# Patient Record
Sex: Male | Born: 1935 | Race: Black or African American | Hispanic: No | Marital: Married | Smoking: Former smoker
Health system: Southern US, Community
[De-identification: ages and names within clinical notes are randomized; demographics above are authoritative.]

## PROBLEM LIST (undated history)

## (undated) DIAGNOSIS — R3914 Feeling of incomplete bladder emptying: Principal | ICD-10-CM

## (undated) DIAGNOSIS — E785 Hyperlipidemia, unspecified: Secondary | ICD-10-CM

## (undated) DIAGNOSIS — K219 Gastro-esophageal reflux disease without esophagitis: Secondary | ICD-10-CM

## (undated) DIAGNOSIS — R972 Elevated prostate specific antigen [PSA]: Secondary | ICD-10-CM

## (undated) DIAGNOSIS — F32A Depression, unspecified: Secondary | ICD-10-CM

## (undated) DIAGNOSIS — F329 Major depressive disorder, single episode, unspecified: Secondary | ICD-10-CM

## (undated) DIAGNOSIS — R338 Other retention of urine: Secondary | ICD-10-CM

## (undated) DIAGNOSIS — H409 Unspecified glaucoma: Secondary | ICD-10-CM

## (undated) DIAGNOSIS — I10 Essential (primary) hypertension: Secondary | ICD-10-CM

## (undated) DIAGNOSIS — N400 Enlarged prostate without lower urinary tract symptoms: Secondary | ICD-10-CM

## (undated) DIAGNOSIS — N401 Enlarged prostate with lower urinary tract symptoms: Principal | ICD-10-CM

## (undated) DIAGNOSIS — J45909 Unspecified asthma, uncomplicated: Secondary | ICD-10-CM

## (undated) DIAGNOSIS — N189 Chronic kidney disease, unspecified: Secondary | ICD-10-CM

## (undated) DIAGNOSIS — F0390 Unspecified dementia without behavioral disturbance: Secondary | ICD-10-CM

## (undated) DIAGNOSIS — N390 Urinary tract infection, site not specified: Secondary | ICD-10-CM

## (undated) HISTORY — DX: Unspecified asthma, uncomplicated: J45.909

## (undated) HISTORY — DX: Other retention of urine: R33.8

## (undated) HISTORY — DX: Unspecified glaucoma: H40.9

## (undated) HISTORY — DX: Elevated prostate specific antigen (PSA): R97.20

## (undated) HISTORY — DX: Benign prostatic hyperplasia with lower urinary tract symptoms: N40.1

## (undated) HISTORY — DX: Hyperlipidemia, unspecified: E78.5

## (undated) HISTORY — DX: Depression, unspecified: F32.A

## (undated) HISTORY — DX: Major depressive disorder, single episode, unspecified: F32.9

## (undated) HISTORY — DX: Chronic kidney disease, unspecified: N18.9

## (undated) HISTORY — DX: Benign prostatic hyperplasia without lower urinary tract symptoms: N40.0

## (undated) HISTORY — DX: Urinary tract infection, site not specified: N39.0

## (undated) HISTORY — DX: Feeling of incomplete bladder emptying: R39.14

## (undated) HISTORY — DX: Gastro-esophageal reflux disease without esophagitis: K21.9

## (undated) HISTORY — DX: Unspecified dementia, unspecified severity, without behavioral disturbance, psychotic disturbance, mood disturbance, and anxiety: F03.90

---

## 2006-03-12 ENCOUNTER — Emergency Department: Payer: Self-pay | Admitting: Emergency Medicine

## 2006-05-26 ENCOUNTER — Other Ambulatory Visit: Payer: Self-pay

## 2006-05-26 ENCOUNTER — Emergency Department: Payer: Self-pay | Admitting: Emergency Medicine

## 2008-12-24 ENCOUNTER — Ambulatory Visit: Payer: Self-pay | Admitting: Family Medicine

## 2009-03-23 ENCOUNTER — Emergency Department: Payer: Self-pay | Admitting: Emergency Medicine

## 2009-10-25 ENCOUNTER — Emergency Department: Payer: Self-pay | Admitting: Emergency Medicine

## 2011-02-10 ENCOUNTER — Ambulatory Visit: Payer: Self-pay | Admitting: Ophthalmology

## 2011-02-22 ENCOUNTER — Ambulatory Visit: Payer: Self-pay | Admitting: Ophthalmology

## 2011-05-09 ENCOUNTER — Ambulatory Visit: Payer: Self-pay | Admitting: Ophthalmology

## 2011-05-23 ENCOUNTER — Ambulatory Visit: Payer: Self-pay | Admitting: Ophthalmology

## 2012-06-07 ENCOUNTER — Emergency Department: Payer: Self-pay | Admitting: Emergency Medicine

## 2012-06-07 LAB — URINALYSIS, COMPLETE
Bilirubin,UR: NEGATIVE
Glucose,UR: NEGATIVE mg/dL (ref 0–75)
Ketone: NEGATIVE
Nitrite: POSITIVE
Ph: 5 (ref 4.5–8.0)
Protein: NEGATIVE
RBC,UR: 1 /HPF (ref 0–5)
Squamous Epithelial: 1
WBC UR: 65 /HPF (ref 0–5)

## 2014-07-26 NOTE — Op Note (Signed)
PATIENT NAME:  Duane MonarchDOWNEY, [name] MR#:  [id number] DATE OF BIRTH:  06/20/1932  DATE OF PROCEDURE:  05/23/2011  PREOPERATIVE DIAGNOSIS: Visually significant cataract of the right eye.   POSTOPERATIVE DIAGNOSIS: Visually significant cataract of the right eye.   OPERATIVE PROCEDURE: Cataract extraction by phacoemulsification with implant of intraocular lens to right eye.   SURGEON: Galen ManilaWilliam Dakin Madani, MD.   ANESTHESIA:  1. Managed anesthesia care.  2. Topical tetracaine drops followed by 2% Xylocaine jelly applied in the preoperative holding area.   COMPLICATIONS: None.   TECHNIQUE:  Stop and chop.  DESCRIPTION OF PROCEDURE: The patient was examined and consented in the preoperative holding area where the aforementioned topical anesthesia was applied to the right eye and then brought back to the Operating Room where the right eye was prepped and draped in the usual sterile ophthalmic fashion and a lid speculum was placed. A paracentesis was created with the side port blade and the anterior chamber was filled with viscoelastic. A near clear corneal incision was performed with the steel keratome. A continuous curvilinear capsulorrhexis was performed with a cystotome followed by the capsulorrhexis forceps. Hydrodissection and hydrodelineation were carried out with BSS on a blunt cannula. The lens was removed in a stop and chop technique and the remaining cortical material was removed with the irrigation-aspiration handpiece. The capsular bag was inflated with viscoelastic and the Alcon SN60WF 23.5-diopter lens, serial number [id number] was placed in the capsular bag without complication. The remaining viscoelastic was removed from the eye with the irrigation-aspiration handpiece. The wounds were hydrated. The anterior chamber was flushed with Miostat and the eye was inflated to physiologic pressure. The wounds were found to be water tight. The eye was dressed with Vigamox and Omnipred. The patient was given  protective glasses to wear throughout the day and a shield with which to sleep tonight. The patient was also given drops with which to begin a drop regimen today and will follow-up with me in one day.  ____________________________ Jerilee FieldWilliam L. Lillyahna Hemberger, MD wlp:slb D: 05/23/2011 12:19:28 ET T: 05/23/2011 12:26:28 ET JOB#: [id number]  cc: Rosan Calbert L. Kammi Hechler, MD, <Dictator> Jerilee FieldWILLIAM L Carlynn Leduc MD ELECTRONICALLY SIGNED 05/26/2011 15:31

## 2014-09-22 ENCOUNTER — Emergency Department
Admission: EM | Admit: 2014-09-22 | Discharge: 2014-09-22 | Disposition: A | Discharge status: Home / Self Care | Payer: Medicare Other | Attending: Emergency Medicine | Admitting: Emergency Medicine

## 2014-09-22 ENCOUNTER — Encounter: Payer: Self-pay | Admitting: Emergency Medicine

## 2014-09-22 DIAGNOSIS — Z79899 Other long term (current) drug therapy: Secondary | ICD-10-CM | POA: Insufficient documentation

## 2014-09-22 DIAGNOSIS — R339 Retention of urine, unspecified: Secondary | ICD-10-CM | POA: Diagnosis not present

## 2014-09-22 DIAGNOSIS — R338 Other retention of urine: Secondary | ICD-10-CM

## 2014-09-22 DIAGNOSIS — I10 Essential (primary) hypertension: Secondary | ICD-10-CM | POA: Insufficient documentation

## 2014-09-22 DIAGNOSIS — N3090 Cystitis, unspecified without hematuria: Secondary | ICD-10-CM | POA: Insufficient documentation

## 2014-09-22 DIAGNOSIS — R109 Unspecified abdominal pain: Secondary | ICD-10-CM | POA: Diagnosis present

## 2014-09-22 HISTORY — DX: Essential (primary) hypertension: I10

## 2014-09-22 LAB — CBC WITH DIFFERENTIAL/PLATELET
BASOS ABS: 0 10*3/uL (ref 0–0.1)
Basophils Relative: 0 %
EOS PCT: 0 %
Eosinophils Absolute: 0 10*3/uL (ref 0–0.7)
HCT: 43.5 % (ref 40.0–52.0)
Hemoglobin: 14.4 g/dL (ref 13.0–18.0)
Lymphocytes Relative: 2 %
Lymphs Abs: 0.2 10*3/uL — ABNORMAL LOW (ref 1.0–3.6)
MCH: 28.1 pg (ref 26.0–34.0)
MCHC: 33.2 g/dL (ref 32.0–36.0)
MCV: 84.6 fL (ref 80.0–100.0)
Monocytes Absolute: 0.2 10*3/uL (ref 0.2–1.0)
Monocytes Relative: 2 %
NEUTROS PCT: 96 %
Neutro Abs: 9.9 10*3/uL — ABNORMAL HIGH (ref 1.4–6.5)
Platelets: 161 10*3/uL (ref 150–440)
RBC: 5.14 MIL/uL (ref 4.40–5.90)
RDW: 15.2 % — ABNORMAL HIGH (ref 11.5–14.5)
WBC: 10.3 10*3/uL (ref 3.8–10.6)

## 2014-09-22 LAB — BASIC METABOLIC PANEL WITH GFR
Anion gap: 6 (ref 5–15)
BUN: 17 mg/dL (ref 6–20)
CO2: 21 mmol/L — ABNORMAL LOW (ref 22–32)
Calcium: 8.8 mg/dL — ABNORMAL LOW (ref 8.9–10.3)
Chloride: 113 mmol/L — ABNORMAL HIGH (ref 101–111)
Creatinine, Ser: 2.27 mg/dL — ABNORMAL HIGH (ref 0.61–1.24)
GFR calc Af Amer: 30 mL/min — ABNORMAL LOW
GFR calc non Af Amer: 26 mL/min — ABNORMAL LOW
Glucose, Bld: 99 mg/dL (ref 65–99)
Potassium: 3.5 mmol/L (ref 3.5–5.1)
Sodium: 140 mmol/L (ref 135–145)

## 2014-09-22 LAB — URINALYSIS COMPLETE WITH MICROSCOPIC (ARMC ONLY)
BILIRUBIN URINE: NEGATIVE
GLUCOSE, UA: NEGATIVE mg/dL
KETONES UR: NEGATIVE mg/dL
Nitrite: POSITIVE — AB
PROTEIN: 30 mg/dL — AB
Specific Gravity, Urine: 1.015 (ref 1.005–1.030)
pH: 5 (ref 5.0–8.0)

## 2014-09-22 MED ORDER — SULFAMETHOXAZOLE-TRIMETHOPRIM 800-160 MG PO TABS: 1.0000 | ORAL_TABLET | Freq: Two times a day (BID) | ORAL | Status: DC

## 2014-09-22 NOTE — ED Notes (Signed)
Here for urinary freq and diarrhea.

## 2014-09-22 NOTE — ED Notes (Signed)
Pt to ed with c/o diarrhea and abd pain x 2 days.  Pt denies vomiting.

## 2014-09-22 NOTE — ED Notes (Signed)
MD at bedside. 

## 2014-09-22 NOTE — ED Provider Notes (Signed)
[facility] Emergency Department Provider Note  ____________________________________________  Time seen: 9:25 AM  I have reviewed the triage vital signs and the nursing notes.   HISTORY  Chief Complaint Diarrhea and Abdominal Pain    HPI Duane Ochoa is a 81 y.o. male who complains of abdominal pain for 2 days. He indicates it hurts across the bilateral lower quadrants. No nausea or vomiting. He has had some loose bowel movements yesterday. No fevers or chills chest pain shortness of breath dizziness lightheadedness or syncope. Never Had anything like this before. No dysuria, but he does report hesitancy and frequency and the feeling that he is not able to empty his bladder.     Past Medical History  Diagnosis Date  . Hypertension     There are no active problems to display for this patient.   History reviewed. No pertinent past surgical history.  Current Outpatient Rx  Name  Route  Sig  Dispense  Refill  . amLODipine (NORVASC) 10 MG tablet   Oral   Take 10 mg by mouth daily.         Marland Kitchen doxazosin (CARDURA) 8 MG tablet   Oral   Take 8 mg by mouth daily.         . hydrochlorothiazide (HYDRODIURIL) 25 MG tablet   Oral   Take 25 mg by mouth daily.         . simvastatin (ZOCOR) 20 MG tablet   Oral   Take 20 mg by mouth daily.         Marland Kitchen sulfamethoxazole-trimethoprim (BACTRIM DS) 800-160 MG per tablet   Oral   Take 1 tablet by mouth 2 (two) times daily.   14 tablet   0     Allergies Review of patient's allergies indicates no known allergies.  History reviewed. No pertinent family history.  Social History History  Substance Use Topics  . Smoking status: Never Smoker   . Smokeless tobacco: Not on file  . Alcohol Use: No    Review of Systems  Constitutional: No fever or chills. No weight changes Eyes:No blurry vision or double vision.  ENT: No sore throat. Cardiovascular: No chest pain. Respiratory: No dyspnea or  cough. Gastrointestinal: Abdominal pain as above.  No BRBPR or melena. Genitourinary: Difficulty urinating.. Musculoskeletal: Negative for back pain. No joint swelling or pain. Skin: Negative for rash. Neurological: Negative for headaches, focal weakness or numbness. Psychiatric:No anxiety or depression.   Endocrine:No hot/cold intolerance, changes in energy, or sleep difficulty.  10-point ROS otherwise negative.  ____________________________________________   PHYSICAL EXAM:  VITAL SIGNS: ED Triage Vitals  Enc Vitals Group     BP 09/22/14 0915 146/71 mmHg     Pulse Rate 09/22/14 0915 124     Resp 09/22/14 0915 18     Temp 09/22/14 0915 97.7 F (36.5 C)     Temp Source 09/22/14 0915 Oral     SpO2 09/22/14 0915 93 %     Weight 09/22/14 0915 235 lb (106.595 kg)     Height 09/22/14 0915 6' (1.829 m)     Head Cir --      Peak Flow --      Pain Score 09/22/14 0915 0     Pain Loc --      Pain Edu? --      Excl. in GC? --      Constitutional: Alert and oriented. Well appearing and in no distress. Eyes: No scleral icterus. No conjunctival pallor. PERRL.  EOMI ENT   Head: Normocephalic and atraumatic.   Nose: No congestion/rhinnorhea. No septal hematoma   Mouth/Throat: MMM, no pharyngeal erythema. No peritonsillar mass. No uvula shift.   Neck: No stridor. No SubQ emphysema. No meningismus. Hematological/Lymphatic/Immunilogical: No cervical lymphadenopathy. Cardiovascular: RRR. Normal and symmetric distal pulses are present in all extremities. No murmurs, rubs, or gallops. Respiratory: Normal respiratory effort without tachypnea nor retractions. Breath sounds are clear and equal bilaterally. No wheezes/rales/rhonchi. Gastrointestinal: Distended tender bladder. There is no CVA tenderness.  No rebound, rigidity, or guarding. Rectal exam reveals a enlarged prostate that is firm and nontender. Brown stool Hemoccult negative, QC controls checked Genitourinary:  deferred Musculoskeletal: Nontender with normal range of motion in all extremities. No joint effusions.  No lower extremity tenderness.  No edema. Neurologic:   Normal speech and language.  CN 2-10 normal. Motor grossly intact. No pronator drift.  Normal gait. No gross focal neurologic deficits are appreciated.  Skin:  Skin is warm, dry and intact. No rash noted.  No petechiae, purpura, or bullae. Psychiatric: Mood and affect are normal. Speech and behavior are normal. Patient exhibits appropriate insight and judgment.  ____________________________________________    LABS (pertinent positives/negatives) (all labs ordered are listed, but only abnormal results are displayed) Labs Reviewed  URINALYSIS COMPLETEWITH MICROSCOPIC ([facility] ONLY) - Abnormal; Notable for the following:    Color, Urine YELLOW (*)    APPearance HAZY (*)    Hgb urine dipstick 3+ (*)    Protein, ur 30 (*)    Nitrite POSITIVE (*)    Leukocytes, UA 3+ (*)    Bacteria, UA MANY (*)    Squamous Epithelial / LPF 0-5 (*)    All other components within normal limits  BASIC METABOLIC PANEL - Abnormal; Notable for the following:    Chloride 113 (*)    CO2 21 (*)    Creatinine, Ser 2.27 (*)    Calcium 8.8 (*)    GFR calc non Af Amer 26 (*)    GFR calc Af Amer 30 (*)    All other components within normal limits  CBC WITH DIFFERENTIAL/PLATELET - Abnormal; Notable for the following:    RDW 15.2 (*)    Neutro Abs 9.9 (*)    Lymphs Abs 0.2 (*)    All other components within normal limits  URINE CULTURE   ____________________________________________   EKG    ____________________________________________    RADIOLOGY    ____________________________________________   PROCEDURES  ____________________________________________   INITIAL IMPRESSION / ASSESSMENT AND PLAN / ED COURSE  Pertinent labs & imaging results that were available during my care of the patient were reviewed by me and considered in my  medical decision making (see chart for details).  Postvoid residual after urination greater than 900 mL's. Foley catheter placed. Labs show a slight elevation of creatinine at 2.2, I was unable to find any prior results in the EMR but this is likely due to the outlet obstruction from enlarged prostate and will now resolve with relief of urinary retention with a Foley catheter. He also has cystitis and urinary tract infection and will be started on Bactrim for this. He is well-appearing no acute distress medically stable and will be discharged home to follow up with primary care and urology.  ____________________________________________   FINAL CLINICAL IMPRESSION(S) / ED DIAGNOSES  Final diagnoses:  Acute urinary retention  Cystitis      Sharman Cheek, MD 09/22/14 1133

## 2014-09-22 NOTE — Discharge Instructions (Signed)
Acute Urinary Retention Acute urinary retention is the temporary inability to urinate. This is a common problem in older men. As men age their prostates become larger and block the flow of urine from the bladder. This is usually a problem that has come on gradually.  HOME CARE INSTRUCTIONS If you are sent home with a Foley catheter and a drainage system, you will need to discuss the best course of action with your health care provider. While the catheter is in, maintain a good intake of fluids. Keep the drainage bag emptied and lower than your catheter. This is so that contaminated urine will not flow back into your bladder, which could lead to a urinary tract infection. There are two main types of drainage bags. One is a large bag that usually is used at night. It has a good capacity that will allow you to sleep through the night without having to empty it. The second type is called a leg bag. It has a smaller capacity, so it needs to be emptied more frequently. However, the main advantage is that it can be attached by a leg strap and can go underneath your clothing, allowing you the freedom to move about or leave your home. Only take over-the-counter or prescription medicines for pain, discomfort, or fever as directed by your health care provider.  SEEK MEDICAL CARE IF:  You develop a low-grade fever.  You experience spasms or leakage of urine with the spasms. SEEK IMMEDIATE MEDICAL CARE IF:   You develop chills or fever.  Your catheter stops draining urine.  Your catheter falls out.  You start to develop increased bleeding that does not respond to rest and increased fluid intake. MAKE SURE YOU:  Understand these instructions.  Will watch your condition.  Will get help right away if you are not doing well or get worse. Document Released: 06/26/2000 Document Revised: 03/25/2013 Document Reviewed: 08/29/2012 [facility] Patient Information 2015 [place], [place]. This information is not  intended to replace advice given to you by your health care provider. Make sure you discuss any questions you have with your health care provider.  Urinary Tract Infection Urinary tract infections (UTIs) can develop anywhere along your urinary tract. Your urinary tract is your body's drainage system for removing wastes and extra water. Your urinary tract includes two kidneys, two ureters, a bladder, and a urethra. Your kidneys are a pair of bean-shaped organs. Each kidney is about the size of your fist. They are located below your ribs, one on each side of your spine. CAUSES Infections are caused by microbes, which are microscopic organisms, including fungi, viruses, and bacteria. These organisms are so small that they can only be seen through a microscope. Bacteria are the microbes that most commonly cause UTIs. SYMPTOMS  Symptoms of UTIs may vary by age and gender of the patient and by the location of the infection. Symptoms in young women typically include a frequent and intense urge to urinate and a painful, burning feeling in the bladder or urethra during urination. Older women and men are more likely to be tired, shaky, and weak and have muscle aches and abdominal pain. A fever may mean the infection is in your kidneys. Other symptoms of a kidney infection include pain in your back or sides below the ribs, nausea, and vomiting. DIAGNOSIS To diagnose a UTI, your caregiver will ask you about your symptoms. Your caregiver also will ask to provide a urine sample. The urine sample will be tested for bacteria and white  blood cells. White blood cells are made by your body to help fight infection. TREATMENT  Typically, UTIs can be treated with medication. Because most UTIs are caused by a bacterial infection, they usually can be treated with the use of antibiotics. The choice of antibiotic and length of treatment depend on your symptoms and the type of bacteria causing your infection. HOME CARE  INSTRUCTIONS  If you were prescribed antibiotics, take them exactly as your caregiver instructs you. Finish the medication even if you feel better after you have only taken some of the medication.  Drink enough water and fluids to keep your urine clear or pale yellow.  Avoid caffeine, tea, and carbonated beverages. They tend to irritate your bladder.  Empty your bladder often. Avoid holding urine for long periods of time.  Empty your bladder before and after sexual intercourse.  After a bowel movement, women should cleanse from front to back. Use each tissue only once. SEEK MEDICAL CARE IF:   You have back pain.  You develop a fever.  Your symptoms do not begin to resolve within 3 days. SEEK IMMEDIATE MEDICAL CARE IF:   You have severe back pain or lower abdominal pain.  You develop chills.  You have nausea or vomiting.  You have continued burning or discomfort with urination. MAKE SURE YOU:   Understand these instructions.  Will watch your condition.  Will get help right away if you are not doing well or get worse. Document Released: 12/28/2004 Document Revised: 09/19/2011 Document Reviewed: 04/28/2011 [facility] Patient Information 2015 [place], [place]. This information is not intended to replace advice given to you by your health care provider. Make sure you discuss any questions you have with your health care provider.   You were found to have a urinary tract infection today. He also have urinary retention which was relieved with a Foley catheter. Keep the Foley catheter in place at all times and follow up with your primary care doctor this week. Also follow-up with urology due to your enlarged prostate that may be causing the urinary retention. Your kidney function was less than normal today (creatinine was 2.2), which is likely due to the urinary retention and will now resolve. Please follow-up with your primary care doctor for further monitoring of this.

## 2014-09-22 NOTE — ED Notes (Signed)
D/C instructions given including care of foley catheter and use of leg bag. Reviewed need for follow up with both MDs. Patient/wife verbalized understanding.

## 2014-09-24 LAB — URINE CULTURE: Special Requests: NORMAL

## 2014-10-13 ENCOUNTER — Encounter: Payer: Self-pay | Admitting: Urology

## 2014-10-13 ENCOUNTER — Ambulatory Visit (INDEPENDENT_AMBULATORY_CARE_PROVIDER_SITE_OTHER): Payer: Medicare Other | Admitting: Urology

## 2014-10-13 VITALS — BP 213/96 | HR 98 | Ht 72.0 in | Wt 236.3 lb

## 2014-10-13 DIAGNOSIS — N3000 Acute cystitis without hematuria: Secondary | ICD-10-CM

## 2014-10-13 DIAGNOSIS — N390 Urinary tract infection, site not specified: Secondary | ICD-10-CM | POA: Insufficient documentation

## 2014-10-13 DIAGNOSIS — R338 Other retention of urine: Secondary | ICD-10-CM

## 2014-10-13 HISTORY — DX: Other retention of urine: R33.8

## 2014-10-13 MED ORDER — FINASTERIDE 5 MG PO TABS: 5.0000 mg | ORAL_TABLET | Freq: Every day | ORAL | Status: DC

## 2014-10-13 MED ORDER — TAMSULOSIN HCL 0.4 MG PO CAPS: 0.4000 mg | ORAL_CAPSULE | Freq: Every day | ORAL | Status: DC

## 2014-10-13 NOTE — Progress Notes (Signed)
Cc: f/u from ED, urinary retention, UTI  History of present illness: 89M who was seen in the ED 2.5 weeks prior for lower quadrant pain and diarrhea.  He was found to have a urinary tract infection and to be in acute urinary retention.  A catheter was placed and 900cc of urine was returned.  He was also started on Bactrim. Prior to the patient's presentation to the emergency department he had progressively worsening urinary frequency culminating in trips to the bathroom every 10-15 minutes. However, he feels as if he empties his bladder completely on a routine basis area he denies any urinary incontinence or urinary urgency. He feels that he has a fairly strong stream. The story is contradicted by his wife, who states that he does have urinary incontinence, postvoid dribbling, has intermittency, and has to strain to void.  The patient has no significant GU history. He is on doxazosin, it is unclear when this medication was started. However, he does not have a history of recurrent urinary tract infections or prostatitis. He has no history of gross hematuria.  Review of systems: A 12 point comprehensive review of systems was obtained and is negative unless otherwise stated in the history of present illness.  Patient Active Problem List   Diagnosis Date Noted  . UTI (urinary tract infection) 10/13/2014  . Acute urinary retention 10/13/2014    Current Outpatient Prescriptions on File Prior to Visit  Medication Sig Dispense Refill  . amLODipine (NORVASC) 10 MG tablet Take 10 mg by mouth daily.    Marland Kitchen. doxazosin (CARDURA) 8 MG tablet Take 8 mg by mouth daily.    . hydrochlorothiazide (HYDRODIURIL) 25 MG tablet Take 25 mg by mouth daily.    . simvastatin (ZOCOR) 20 MG tablet Take 20 mg by mouth daily.    Marland Kitchen. sulfamethoxazole-trimethoprim (BACTRIM DS) 800-160 MG per tablet Take 1 tablet by mouth 2 (two) times daily. 14 tablet 0   No current facility-administered medications on file prior to visit.     Past Medical History  Diagnosis Date  . Hypertension     No past surgical history on file.  History  Substance Use Topics  . Smoking status: Never Smoker   . Smokeless tobacco: Not on file  . Alcohol Use: No    No family history on file.  PE: There were no vitals filed for this visit. Patient appears to be in no acute distress  patient is alert and oriented x3 Atraumatic normocephalic head No cervical or supraclavicular lymphadenopathy appreciated No increased work of breathing, no audible wheezes/rhonchi Regular sinus rhythm/rate Abdomen is soft, nontender, nondistended, no CVA or suprapubic tenderness The patient's rectal exam demonstrates mildly enlarged prostate, smooth and symmetric. 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 Lower extremities are symmetric without appreciable edema Grossly neurologically intact   No results for input(s): WBC, HGB, HCT in the last 72 hours. No results for input(s): NA, K, CL, CO2, GLUCOSE, BUN, CREATININE, CALCIUM in the last 72 hours. No results for input(s): LABPT, INR in the last 72 hours. No results for input(s): LABURIN in the last 72 hours. Results for orders placed or performed during the hospital encounter of 09/22/14  Urine culture     Status: None   Collection Time: 09/22/14  9:52 AM  Result Value Ref Range Status   Specimen Description URINE, CLEAN CATCH  Final   Special Requests Normal  Final   Culture   Final  MULTIPLE SPECIES PRESENT, SUGGEST RECOLLECTION IF CLINICALLY INDICATED   Report Status 09/24/2014 FINAL  Final    Imaging: none  Imp: The patient developed acute urinary retention with concomitant urinary tract infection 3 weeks prior. The catheter was removed today and upon filling the bladder prior to removal he had multiple bladder spasms and as such, we are unable to truly do a voiding trial today. The patient does not appear to be  responding to doxazosin at this time. His prostate is certainly enlarged on exam and likely the culprit of his urinary retention. 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 These are stable on this point asymptomatic requiring no intervention.  Recommendations: I recommended the patient discontinue his doxazosin to start Flomax 0.4 mg daily. In addition, I recommended that the patient started on finasteride 5 mg daily. Given that the patient did not truly have a adequate voiding trial today, I recommended that he return to clinic tomorrow for a nurse visit on May, so that we can check a PVR. I will then follow up the patient 3 months to ensure that his voiding symptoms have improved.  Cc: Doristine Mango, NP [place], [place]

## 2014-10-13 NOTE — Progress Notes (Signed)
Fill and Pull Catheter Removal  Patient is present today for a catheter removal.  Patient was cleaned and prepped in a sterile fashion 70ml of sterile water/ saline was instilled into the bladder when the patient felt the urge to urinate. 10ml of water was then drained from the balloon.  A 16FR foley cath was removed from the bladder complications were noted as: bladder spasms, patient unable to hold urine while instilling the sterile water into the bladder, patient also was experiencing discomfort, after two attempts to fill I stopped and removed the catheter. .  Patient as then given some time to void on their own.  Patient cannot void    Preformed by: Eligha BridegroomSarah Watts, CMA  Follow up/ Additional notes: Patient was not able to void and per Dr. Marlou PorchHerrick will come back tomorrow for a PVR

## 2014-10-14 ENCOUNTER — Other Ambulatory Visit (INDEPENDENT_AMBULATORY_CARE_PROVIDER_SITE_OTHER): Payer: Medicare Other | Admitting: *Deleted

## 2014-10-14 VITALS — BP 160/82 | HR 90 | Resp 18 | Ht 72.0 in | Wt 238.0 lb

## 2014-10-14 DIAGNOSIS — N400 Enlarged prostate without lower urinary tract symptoms: Secondary | ICD-10-CM | POA: Diagnosis not present

## 2014-10-14 LAB — BLADDER SCAN AMB NON-IMAGING

## 2014-10-14 MED ORDER — FINASTERIDE 5 MG PO TABS: 5.0000 mg | ORAL_TABLET | Freq: Every day | ORAL | Status: DC

## 2014-10-14 MED ORDER — TAMSULOSIN HCL 0.4 MG PO CAPS: 0.4000 mg | ORAL_CAPSULE | Freq: Every day | ORAL | Status: DC

## 2014-10-14 NOTE — Progress Notes (Signed)
Continuous Intermittent Catheterization  Due to urinary retention patient is present today for a teaching of self I & O Catheterization. Patient was given detailed verbal and printed instructions of self catheterization. Patient was cleaned and prepped in a sterile fashion.  With instruction and assistance patient inserted a 14FR and urine return was noted 400 ml, urine was yellow in color. Patient tolerated well, no complications were noted Patient was given a sample bag with supplies to take home.  Instructions were given per protocol for patient to cath 4 times daily.  An order was placed with [employer] for catheters to be sent to the patient's home. Patient is to follow up in three (3) months.   Preformed by: Natividad BroodSteve Villa Burgin, CMA

## 2014-10-30 ENCOUNTER — Ambulatory Visit: Payer: Self-pay

## 2014-11-17 ENCOUNTER — Other Ambulatory Visit: Payer: Self-pay

## 2014-11-17 DIAGNOSIS — R339 Retention of urine, unspecified: Secondary | ICD-10-CM

## 2014-11-17 MED ORDER — TAMSULOSIN HCL 0.4 MG PO CAPS: 0.4000 mg | ORAL_CAPSULE | Freq: Every day | ORAL | Status: AC

## 2014-11-28 ENCOUNTER — Emergency Department
Admission: EM | Admit: 2014-11-28 | Discharge: 2014-11-28 | Disposition: A | Discharge status: Home / Self Care | Payer: Medicare Other | Attending: Emergency Medicine | Admitting: Emergency Medicine

## 2014-11-28 ENCOUNTER — Encounter: Payer: Self-pay | Admitting: Emergency Medicine

## 2014-11-28 DIAGNOSIS — Z79899 Other long term (current) drug therapy: Secondary | ICD-10-CM | POA: Diagnosis not present

## 2014-11-28 DIAGNOSIS — N3000 Acute cystitis without hematuria: Secondary | ICD-10-CM

## 2014-11-28 DIAGNOSIS — Z87891 Personal history of nicotine dependence: Secondary | ICD-10-CM | POA: Insufficient documentation

## 2014-11-28 DIAGNOSIS — I10 Essential (primary) hypertension: Secondary | ICD-10-CM | POA: Insufficient documentation

## 2014-11-28 DIAGNOSIS — R350 Frequency of micturition: Secondary | ICD-10-CM | POA: Diagnosis present

## 2014-11-28 LAB — GLUCOSE, CAPILLARY: GLUCOSE-CAPILLARY: 94 mg/dL (ref 65–99)

## 2014-11-28 LAB — URINALYSIS COMPLETE WITH MICROSCOPIC (ARMC ONLY)
Bilirubin Urine: NEGATIVE
Glucose, UA: NEGATIVE mg/dL
Nitrite: POSITIVE — AB
PROTEIN: 30 mg/dL — AB
Specific Gravity, Urine: 1.019 (ref 1.005–1.030)
pH: 5 (ref 5.0–8.0)

## 2014-11-28 MED ORDER — CIPROFLOXACIN HCL 500 MG PO TABS: 500.0000 mg | ORAL_TABLET | Freq: Two times a day (BID) | ORAL | Status: DC

## 2014-11-28 NOTE — ED Notes (Signed)
Pt with urinary frequency. Wets self when asleep. Answers yes when asked if a little urine comes out without warning. A/o.

## 2014-11-28 NOTE — ED Provider Notes (Signed)
[facility] Emergency Department Provider Note ____________________________________________  Time seen: Approximately 9:42 AM  I have reviewed the triage vital signs and the nursing notes.   HISTORY  Chief Complaint Urinary Frequency   HPI Duane Ochoa is a 81 y.o. male who presents to the emergency department for her urinary frequency. He states that for the past 4 days he has had to urinate every few minutes and states that he has had some incontinence at night. He denies abdominal pain. He denies fever.   Past Medical History  Diagnosis Date  . Hypertension   . GERD (gastroesophageal reflux disease)   . Asthma   . Depressed   . Glaucoma   . Hyperlipemia     Patient Active Problem List   Diagnosis Date Noted  . UTI (urinary tract infection) 10/13/2014  . Acute urinary retention 10/13/2014    Past Surgical History  Procedure Laterality Date  . None      Current Outpatient Rx  Name  Route  Sig  Dispense  Refill  . amLODipine (NORVASC) 10 MG tablet   Oral   Take 10 mg by mouth daily.         . ciprofloxacin (CIPRO) 500 MG tablet   Oral   Take 1 tablet (500 mg total) by mouth 2 (two) times daily.   10 tablet   0   . finasteride (PROSCAR) 5 MG tablet   Oral   Take 1 tablet (5 mg total) by mouth daily.   30 tablet   11   . hydrochlorothiazide (HYDRODIURIL) 25 MG tablet   Oral   Take 25 mg by mouth daily.         . simvastatin (ZOCOR) 20 MG tablet   Oral   Take 20 mg by mouth daily.         . tamsulosin (FLOMAX) 0.4 MG CAPS capsule   Oral   Take 1 capsule (0.4 mg total) by mouth daily.   30 capsule   0   . tamsulosin (FLOMAX) 0.4 MG CAPS capsule   Oral   Take 1 capsule (0.4 mg total) by mouth daily.   30 capsule   3     Allergies Review of patient's allergies indicates no known allergies.  Family History  Problem Relation Age of Onset  . Prostate cancer Brother     Social History Social History   Substance Use Topics  . Smoking status: Former Smoker -- 15 years    Quit date: 04/03/1994  . Smokeless tobacco: None  . Alcohol Use: No     Comment: quit in 1996    Review of Systems Constitutional: No fever/chills Eyes: No visual changes. ENT: No sore throat. Cardiovascular: Denies chest pain. Respiratory: Denies shortness of breath. Gastrointestinal: No abdominal pain.  No nausea, no vomiting.  No diarrhea.  No constipation. Genitourinary: Negative for dysuria. Musculoskeletal: Negative for back pain. Skin: Negative for rash. Neurological: Negative for headaches, focal weakness or numbness.  10-point ROS otherwise negative.  ____________________________________________   PHYSICAL EXAM:  VITAL SIGNS: ED Triage Vitals  Enc Vitals Group     BP 11/28/14 0747 147/73 mmHg     Pulse Rate 11/28/14 0747 88     Resp --      Temp 11/28/14 0747 97.5 F (36.4 C)     Temp Source 11/28/14 0747 Oral     SpO2 11/28/14 0747 96 %     Weight 11/28/14 0740 235 lb (106.595 kg)  Height 11/28/14 0740 6' (1.829 m)     Head Cir --      Peak Flow --      Pain Score 11/28/14 0740 0     Pain Loc --      Pain Edu? --      Excl. in GC? --     Constitutional: Alert and oriented. Well appearing and in no acute distress. Eyes: Conjunctivae are normal. PERRL. EOMI. Head: Atraumatic. Nose: No congestion/rhinnorhea. Mouth/Throat: Mucous membranes are moist.  Oropharynx non-erythematous. Neck: No stridor.   Cardiovascular: Normal rate, regular rhythm. Grossly normal heart sounds.  Good peripheral circulation. Respiratory: Normal respiratory effort.  No retractions. Lungs CTAB. Gastrointestinal: Soft and nontender. No distention. No abdominal bruits. No CVA tenderness. Musculoskeletal: No lower extremity tenderness nor edema.  No joint effusions. Neurologic:  Normal speech and language. No gross focal neurologic deficits are appreciated. No gait instability. Skin:  Skin is warm, dry and  intact. No rash noted. Psychiatric: Mood and affect are normal. Speech and behavior are normal.  ____________________________________________   LABS (all labs ordered are listed, but only abnormal results are displayed)  Labs Reviewed  URINALYSIS COMPLETEWITH MICROSCOPIC ([facility] ONLY) - Abnormal; Notable for the following:    Color, Urine AMBER (*)    APPearance CLOUDY (*)    Ketones, ur TRACE (*)    Hgb urine dipstick 2+ (*)    Protein, ur 30 (*)    Nitrite POSITIVE (*)    Leukocytes, UA 3+ (*)    Bacteria, UA MANY (*)    Squamous Epithelial / LPF 0-5 (*)    All other components within normal limits  GLUCOSE, CAPILLARY   ____________________________________________  EKG   ____________________________________________  RADIOLOGY   ____________________________________________   PROCEDURES  Procedure(s) performed: None  Critical Care performed: No  ____________________________________________   INITIAL IMPRESSION / ASSESSMENT AND PLAN / ED COURSE  Pertinent labs & imaging results that were available during my care of the patient were reviewed by me and considered in my medical decision making (see chart for details).  Patient was advised to follow-up with his primary care provider within 10 days for urine recheck he was advised to return to emergency department for symptoms that change or worsen if he is unable schedule an appointment. ____________________________________________   FINAL CLINICAL IMPRESSION(S) / ED DIAGNOSES  Final diagnoses:  Acute cystitis without hematuria      Chinita Pester, FNP 11/28/14 1132  Governor Rooks, MD 11/28/14 1359

## 2014-11-28 NOTE — ED Notes (Signed)
Urinary frequency x 4 days.

## 2014-11-28 NOTE — Discharge Instructions (Signed)

## 2014-12-04 ENCOUNTER — Encounter: Payer: Self-pay | Admitting: *Deleted

## 2014-12-08 ENCOUNTER — Ambulatory Visit (INDEPENDENT_AMBULATORY_CARE_PROVIDER_SITE_OTHER): Payer: Medicare Other | Admitting: Urology

## 2014-12-08 VITALS — BP 170/90 | HR 73 | Ht 72.0 in | Wt 232.2 lb

## 2014-12-08 DIAGNOSIS — R350 Frequency of micturition: Secondary | ICD-10-CM

## 2014-12-08 DIAGNOSIS — N183 Chronic kidney disease, stage 3 (moderate): Secondary | ICD-10-CM | POA: Diagnosis not present

## 2014-12-08 DIAGNOSIS — N189 Chronic kidney disease, unspecified: Secondary | ICD-10-CM | POA: Insufficient documentation

## 2014-12-08 DIAGNOSIS — N390 Urinary tract infection, site not specified: Secondary | ICD-10-CM | POA: Diagnosis not present

## 2014-12-08 DIAGNOSIS — R338 Other retention of urine: Secondary | ICD-10-CM

## 2014-12-08 DIAGNOSIS — N3000 Acute cystitis without hematuria: Secondary | ICD-10-CM | POA: Diagnosis not present

## 2014-12-08 HISTORY — DX: Chronic kidney disease, unspecified: N18.9

## 2014-12-08 HISTORY — DX: Urinary tract infection, site not specified: N39.0

## 2014-12-08 LAB — URINALYSIS, COMPLETE
Bilirubin, UA: NEGATIVE
Glucose, UA: NEGATIVE
Ketones, UA: NEGATIVE
Nitrite, UA: POSITIVE — AB
PH UA: 5.5 (ref 5.0–7.5)
Protein, UA: NEGATIVE
Specific Gravity, UA: 1.02 (ref 1.005–1.030)
Urobilinogen, Ur: 0.2 mg/dL (ref 0.2–1.0)

## 2014-12-08 LAB — MICROSCOPIC EXAMINATION
EPITHELIAL CELLS (NON RENAL): NONE SEEN /HPF (ref 0–10)
RBC, UA: NONE SEEN /hpf (ref 0–?)

## 2014-12-08 LAB — BLADDER SCAN AMB NON-IMAGING

## 2014-12-08 NOTE — Addendum Note (Signed)
Addended by: Martha Clan on: 12/08/2014 04:08 PM   Modules accepted: Orders

## 2014-12-08 NOTE — Progress Notes (Signed)
12/08/2014 10:46 AM   Duane Ochoa 1932-05-25 [id number]  Referring provider: Titus Mould, NP [address] [place], [place] [address]  No chief complaint on file.   HPI:  1 - Lower Urinary Tract Symptoms / History of Urinary Retention - Episode retention 2016 req foley for volume, subsequently placed on tamsulosin + finasteride and passed trial of void 10/2014. Now minimal bother.  2 - Recurrent UTI - two episodes cystitis 2016,  Both polymicrobial. No recent upper tract imaging. PVR 2016 "0mL"  3 - Chronic Renal Insuficiency - Cr 2.2 noted on labs 2016. No piror renal imaging on file.     PMH: Past Medical History  Diagnosis Date  . Hypertension   . GERD (gastroesophageal reflux disease)   . Asthma   . Depressed   . Glaucoma   . Hyperlipemia   . Elevated PSA   . BPH (benign prostatic hyperplasia)   . Dementia     Surgical History: Past Surgical History  Procedure Laterality Date  . None      Home Medications:    Medication List       This list is accurate as of: 12/08/14 10:46 AM.  Always use your most recent med list.               amLODipine 10 MG tablet  Commonly known as:  NORVASC  Take 10 mg by mouth daily.     ciprofloxacin 500 MG tablet  Commonly known as:  CIPRO  Take 1 tablet (500 mg total) by mouth 2 (two) times daily.     finasteride 5 MG tablet  Commonly known as:  PROSCAR  Take 1 tablet (5 mg total) by mouth daily.     hydrochlorothiazide 25 MG tablet  Commonly known as:  HYDRODIURIL  Take 25 mg by mouth daily.     simvastatin 20 MG tablet  Commonly known as:  ZOCOR  Take 20 mg by mouth daily.     tamsulosin 0.4 MG Caps capsule  Commonly known as:  FLOMAX  Take 1 capsule (0.4 mg total) by mouth daily.     tamsulosin 0.4 MG Caps capsule  Commonly known as:  FLOMAX  Take 1 capsule (0.4 mg total) by mouth daily.        Allergies: No Known Allergies  Family History: Family History  Problem Relation Age  of Onset  . Prostate cancer Brother     Social History:  reports that he quit smoking about 20 years ago. He does not have any smokeless tobacco history on file. He reports that he does not drink alcohol or use illicit drugs.  ROS:     Review of Systems  Gastrointestinal (upper)  : Negative for upper GI symptoms  Gastrointestinal (lower) : Negative for lower GI symptoms  Constitutional : Negative for symptoms  Skin: Negative for skin symptoms  Eyes: Negative for eye symptoms  Ear/Nose/Throat : Negative for Ear/Nose/Throat symptoms  Hematologic/Lymphatic: Negative for Hematologic/Lymphatic symptoms  Cardiovascular : Negative for cardiovascular symptoms  Respiratory : Negative for respiratory symptoms  Endocrine: Negative for endocrine symptoms  Musculoskeletal: Negative for musculoskeletal symptoms  Neurological: Negative for neurological symptoms  Psychologic: Negative for psychiatric symptoms                                       Physical Exam: There were no vitals taken for this visit.  Constitutional:  Alert and oriented, No acute distress. HEENT: [place] AT, moist mucus membranes.  Trachea midline, no masses. Cardiovascular: No clubbing, cyanosis, or edema. Respiratory: Normal respiratory effort, no increased work of breathing. GI: Abdomen is soft, nontender, nondistended, no abdominal masses GU: No CVA tenderness. Phallus straight, multiple stable scrotal condyloma.  Skin: No rashes, bruises or suspicious lesions. Lymph: No cervical or inguinal adenopathy. Neurologic: Grossly intact, no focal deficits, moving all 4 extremities. Psychiatric: Normal mood and affect.  Laboratory Data: Lab Results  Component Value Date   WBC 10.3 09/22/2014   HGB 14.4 09/22/2014   HCT 43.5 09/22/2014   MCV 84.6 09/22/2014   PLT 161 09/22/2014    Lab Results  Component Value Date   CREATININE 2.27* 09/22/2014    No results found for:  PSA  No results found for: TESTOSTERONE  No results found for: HGBA1C  Urinalysis    Component Value Date/Time   COLORURINE AMBER* 11/28/2014 0919   COLORURINE Yellow 06/07/2012 2124   APPEARANCEUR CLOUDY* 11/28/2014 0919   APPEARANCEUR Cloudy 06/07/2012 2124   LABSPEC 1.019 11/28/2014 0919   LABSPEC 1.018 06/07/2012 2124   PHURINE 5.0 11/28/2014 0919   PHURINE 5.0 06/07/2012 2124   GLUCOSEU NEGATIVE 11/28/2014 0919   GLUCOSEU Negative 06/07/2012 2124   HGBUR 2+* 11/28/2014 0919   HGBUR Negative 06/07/2012 2124   BILIRUBINUR NEGATIVE 11/28/2014 0919   BILIRUBINUR Negative 06/07/2012 2124   KETONESUR TRACE* 11/28/2014 0919   KETONESUR Negative 06/07/2012 2124   PROTEINUR 30* 11/28/2014 0919   PROTEINUR Negative 06/07/2012 2124   NITRITE POSITIVE* 11/28/2014 0919   NITRITE Positive 06/07/2012 2124   LEUKOCYTESUR 3+* 11/28/2014 0919   LEUKOCYTESUR 3+ 06/07/2012 2124    Pertinent Imaging: none  Assessment & Plan:     1 - Lower Urinary Tract Symptoms / History of Urinary Retention - no recurrent retention and much better symptoms on alpha blocker and 5ARI, continue.   2 - Recurrent UTI - he empties well. Rec upper tract imaging with KUB/Renal US on return to r/o sig hydro or stones as nidus.   3 - Chronic Renal Insuficiency - KUB, Renal US on return to r/o sig hydro. Though I suspect mostly medical renal.   4 - RTC 1 mo with imaging, then annually if favorable.    Sebastian Ache, MD  [facility] [address], [address] [place], [place] [address] [phone]

## 2015-01-05 ENCOUNTER — Ambulatory Visit (INDEPENDENT_AMBULATORY_CARE_PROVIDER_SITE_OTHER): Payer: Medicare Other | Admitting: Urology

## 2015-01-05 VITALS — BP 153/74 | HR 74 | Ht 72.0 in | Wt 226.5 lb

## 2015-01-05 DIAGNOSIS — N183 Chronic kidney disease, stage 3 (moderate): Secondary | ICD-10-CM

## 2015-01-05 NOTE — Progress Notes (Signed)
12/08/2014 10:46 AM   Duane Ochoa Aug 13, 1932 [id number]  Referring provider: Titus Mould, NP [address] [place], [place] [address]  No chief complaint on file.   HPI:  1 - Lower Urinary Tract Symptoms / History of Urinary Retention - Episode retention 2016 req foley for volume, subsequently placed on tamsulosin + finasteride and passed trial of void 10/2014. Now minimal bother.  2 - Recurrent UTI - two episodes cystitis 2016,  Both polymicrobial. No recent upper tract imaging. PVR 2016 "0mL"  3 - Chronic Renal Insuficiency - Cr 2.2 noted on labs 2016. No piror renal imaging on file.    Today "Duane" is seen in f/u above. No interval UTI. He was to have KUB and [place] prior to visit today but he did not complete despite several calls from our office to help arrange.  PMH: Past Medical History  Diagnosis Date  . Hypertension   . GERD (gastroesophageal reflux disease)   . Asthma   . Depressed   . Glaucoma   . Hyperlipemia   . Elevated PSA   . BPH (benign prostatic hyperplasia)   . Dementia     Surgical History: Past Surgical History  Procedure Laterality Date  . None      Home Medications:    Medication List       This list is accurate as of: 12/08/14 10:46 AM.  Always use your most recent med list.               amLODipine 10 MG tablet  Commonly known as:  NORVASC  Take 10 mg by mouth daily.     ciprofloxacin 500 MG tablet  Commonly known as:  CIPRO  Take 1 tablet (500 mg total) by mouth 2 (two) times daily.     finasteride 5 MG tablet  Commonly known as:  PROSCAR  Take 1 tablet (5 mg total) by mouth daily.     hydrochlorothiazide 25 MG tablet  Commonly known as:  HYDRODIURIL  Take 25 mg by mouth daily.     simvastatin 20 MG tablet  Commonly known as:  ZOCOR  Take 20 mg by mouth daily.     tamsulosin 0.4 MG Caps capsule  Commonly known as:  FLOMAX  Take 1 capsule (0.4 mg total) by mouth daily.     tamsulosin 0.4 MG Caps  capsule  Commonly known as:  FLOMAX  Take 1 capsule (0.4 mg total) by mouth daily.        Allergies: No Known Allergies  Family History: Family History  Problem Relation Age of Onset  . Prostate cancer Brother     Social History:  reports that he quit smoking about 20 years ago. He does not have any smokeless tobacco history on file. He reports that he does not drink alcohol or use illicit drugs.  ROS:     Review of Systems  Gastrointestinal (upper)  : Negative for upper GI symptoms  Gastrointestinal (lower) : Negative for lower GI symptoms  Constitutional : Negative for symptoms  Skin: Negative for skin symptoms  Eyes: Negative for eye symptoms  Ear/Nose/Throat : Negative for Ear/Nose/Throat symptoms  Hematologic/Lymphatic: Negative for Hematologic/Lymphatic symptoms  Cardiovascular : Negative for cardiovascular symptoms  Respiratory : Negative for respiratory symptoms  Endocrine: Negative for endocrine symptoms  Musculoskeletal: Negative for musculoskeletal symptoms  Neurological: Negative for neurological symptoms  Psychologic: Negative for psychiatric symptoms   Physical Exam: There were no vitals taken for this visit.  Constitutional:  Alert and oriented, No acute distress. HEENT: [place] AT, moist mucus membranes.  Trachea midline, no masses. Cardiovascular: No clubbing, cyanosis, or edema. Respiratory: Normal respiratory effort, no increased work of breathing. GI: Abdomen is soft, nontender, nondistended, no abdominal masses GU: No CVA tenderness. Phallus straight, multiple stable scrotal condyloma.  Skin: No rashes, bruises or suspicious lesions. Lymph: No cervical or inguinal adenopathy. Neurologic: Grossly intact, no focal deficits, moving all 4 extremities. Psychiatric: Normal mood and affect.  Laboratory Data: Lab Results  Component Value Date   WBC 10.3 09/22/2014   HGB 14.4 09/22/2014   HCT 43.5 09/22/2014   MCV 84.6  09/22/2014   PLT 161 09/22/2014    Lab Results  Component Value Date   CREATININE 2.27* 09/22/2014    No results found for: PSA  No results found for: TESTOSTERONE  No results found for: HGBA1C  Pertinent Imaging: none  Assessment & Plan:     1 - Lower Urinary Tract Symptoms / History of Urinary Retention - no recurrent retention and much better symptoms on alpha blocker and 5ARI, continue.   2 - Recurrent UTI - he empties well. Still rec upper tract imaging with KUB/Renal US to r/o sig hydro or stones as nidus. Pt and his wife given number to schedule today. Ideally he will call to schedule and have these studies done soon to r/o additional modifiable factors for this or renal insuficiency.   3 - Chronic Renal Insuficiency - KUB, Renal US on return to r/o sig hydro. Though I suspect mostly medical renal.   4 - RTC 1 year with imaging next avail. Will notify him of imaging results when available. Marland Kitchen    Sebastian Ache, MD  [facility] [address], [address] [place], [place] [address] [phone]

## 2015-01-11 ENCOUNTER — Ambulatory Visit
Admission: RE | Admit: 2015-01-11 | Discharge: 2015-01-11 | Disposition: A | Discharge status: Home / Self Care | Payer: Medicare Other | Source: Ambulatory Visit | Attending: Urology | Admitting: Urology

## 2015-01-11 DIAGNOSIS — N390 Urinary tract infection, site not specified: Secondary | ICD-10-CM

## 2015-01-11 DIAGNOSIS — N400 Enlarged prostate without lower urinary tract symptoms: Secondary | ICD-10-CM | POA: Diagnosis not present

## 2015-01-11 DIAGNOSIS — N183 Chronic kidney disease, stage 3 (moderate): Secondary | ICD-10-CM

## 2015-01-13 ENCOUNTER — Ambulatory Visit: Payer: Medicare Other

## 2015-01-25 ENCOUNTER — Telehealth: Payer: Self-pay

## 2015-01-25 NOTE — Telephone Encounter (Signed)
Pt wife called requesting results of KUB and renal u/s. Please advise.

## 2015-01-27 NOTE — Telephone Encounter (Signed)
Patient notified/SW 

## 2015-01-27 NOTE — Telephone Encounter (Signed)
-----   Message from Duane Acheheodore Manny, MD sent at 01/26/2015  4:55 PM EDT ----- Regarding: imaging results Please inform [name] that recent X-ray and ultrasound reassuring. No stones or kidney swelling. Large prostate noted which is the reason for the finasteride.  Rec continue current meds and f/u in 1 year as planned last visit.  Thanks, Duane Ochoa

## 2015-02-15 ENCOUNTER — Encounter: Payer: Self-pay | Admitting: Urology

## 2015-02-15 ENCOUNTER — Ambulatory Visit (INDEPENDENT_AMBULATORY_CARE_PROVIDER_SITE_OTHER): Payer: Medicare Other | Admitting: Urology

## 2015-02-15 VITALS — BP 169/71 | HR 72 | Ht 72.0 in | Wt 227.5 lb

## 2015-02-15 DIAGNOSIS — N401 Enlarged prostate with lower urinary tract symptoms: Secondary | ICD-10-CM | POA: Diagnosis not present

## 2015-02-15 DIAGNOSIS — R3914 Feeling of incomplete bladder emptying: Secondary | ICD-10-CM | POA: Diagnosis not present

## 2015-02-15 DIAGNOSIS — R350 Frequency of micturition: Secondary | ICD-10-CM | POA: Diagnosis not present

## 2015-02-15 DIAGNOSIS — R351 Nocturia: Secondary | ICD-10-CM

## 2015-02-15 HISTORY — DX: Feeling of incomplete bladder emptying: R39.14

## 2015-02-15 HISTORY — DX: Benign prostatic hyperplasia with lower urinary tract symptoms: N40.1

## 2015-02-15 LAB — BLADDER SCAN AMB NON-IMAGING

## 2015-02-15 NOTE — Progress Notes (Signed)
Progress Notes   Duane Ochoa (MR# [id number])      Progress Notes Info    Author Note Status Last Update User Last Update Date/Time   Sebastian Acheheodore Manny, MD Signed Sebastian Acheheodore Manny, MD 01/05/2015 10:15 AM    Progress Notes    Expand All Collapse All     12/08/2014 10:46 AM   Duane Ochoa Dec 05, 1932 [id number]  Referring provider: Titus MouldElizabeth Burney White, NP [address] [address] [place], [place] [id number]  No chief complaint on file.   HPI:  1 - Lower Urinary Tract Symptoms / History of Urinary Retention - Episode retention 2016 req foley for 900mL volume, subsequently placed on tamsulosin + finasteride and passed trial of void 10/2014. Now minimal bother.  2 - Recurrent UTI - two episodes cystitis 2016, Both polymicrobial. No recent upper tract imaging. PVR 2016 "0mL"  3 - Chronic Renal Insuficiency - Cr 2.2 noted on labs 2016. No piror renal imaging on file.   Today "Duane" is seen in f/u above.   He had the KUB and Renal US.   No stones or hydro were seen but he was found to have a 2cm mass at the base of the bladder which could be prostatic but cystoscopy was recommended.    He has 160ml in his bladder today but couldn't give a specimen.  He currently goes 4-5x nightly but it is not as bad at night.  He has had no hematuria or dysuria.     PMH: Past Medical History  Diagnosis Date  . Hypertension   . GERD (gastroesophageal reflux disease)   . Asthma   . Depressed   . Glaucoma   . Hyperlipemia   . Elevated PSA   . BPH (benign prostatic hyperplasia)   . Dementia     Surgical History: Past Surgical History  Procedure Laterality Date  . None      Home Medications:    Medication List       This list is accurate as of: 12/08/14 10:46 AM. Always use your most recent med list.              amLODipine 10 MG tablet  Commonly known as: NORVASC  Take 10 mg by mouth daily.     ciprofloxacin 500 MG tablet    Commonly known as: CIPRO  Take 1 tablet (500 mg total) by mouth 2 (two) times daily.     finasteride 5 MG tablet  Commonly known as: PROSCAR  Take 1 tablet (5 mg total) by mouth daily.     hydrochlorothiazide 25 MG tablet  Commonly known as: HYDRODIURIL  Take 25 mg by mouth daily.     simvastatin 20 MG tablet  Commonly known as: ZOCOR  Take 20 mg by mouth daily.     tamsulosin 0.4 MG Caps capsule  Commonly known as: FLOMAX  Take 1 capsule (0.4 mg total) by mouth daily.     tamsulosin 0.4 MG Caps capsule  Commonly known as: FLOMAX  Take 1 capsule (0.4 mg total) by mouth daily.        Allergies: No Known Allergies  Family History: Family History  Problem Relation Age of Onset  . Prostate cancer Brother     Social History: reports that he quit smoking about 20 years ago. He does not have any smokeless tobacco history on file. He reports that he does not drink alcohol or use illicit drugs.  ROS:    Review of Systems  Gastrointestinal (upper) : Negative for  upper GI symptoms  Gastrointestinal (lower) : Negative for lower GI symptoms  Constitutional : Negative for symptoms  Skin: Negative for skin symptoms  Eyes: Negative for eye symptoms  Ear/Nose/Throat : Negative for Ear/Nose/Throat symptoms  Hematologic/Lymphatic: Negative for Hematologic/Lymphatic symptoms  Cardiovascular : Negative for cardiovascular symptoms  Respiratory : Negative for respiratory symptoms  Endocrine: Negative for endocrine symptoms  Musculoskeletal: Negative for musculoskeletal symptoms  Neurological: Negative for neurological symptoms  Psychologic: Negative for psychiatric symptoms   Physical Exam: BP 169/71 mmHg  Pulse 72  Ht 6' (1.829 m)  Wt 227 lb 8 oz (103.193 kg)  BMI 30.85 kg/m2  Laboratory Data: Lab Results  Component Value Date   WBC 10.3 09/22/2014   HGB 14.4 09/22/2014   HCT 43.5  09/22/2014   MCV 84.6 09/22/2014   PLT 161 09/22/2014    Lab Results  Component Value Date   CREATININE 2.27* 09/22/2014    No results found for: PSA  No results found for: TESTOSTERONE  No results found for: HGBA1C  Pertinent Imaging: KUB and renal US reports reviewed.   Assessment & Plan:    1 - Lower Urinary Tract Symptoms / History of Urinary Retention - He has increased nocturia with a moderate PVR and needs cystoscopy and a flow rate to further evaluate his symptoms  2 - Recurrent UTI - he empties well. Still rec upper tract imaging with KUB/Renal US to r/o sig hydro or stones as nidus. Pt and his wife given number to schedule today. Ideally he will call to schedule and have these studies done soon to r/o additional modifiable factors for this or renal insuficiency.   3 - Chronic Renal Insuficiency - KUB, Renal US on return to r/o sig hydro. Though I suspect mostly medical renal.   4 - RTC for cystoscopy and a flowrate in the near future  Bjorn Pippin, MD  [facility] [address], [address] [place], [place] [address] [phone]

## 2015-03-09 ENCOUNTER — Ambulatory Visit (INDEPENDENT_AMBULATORY_CARE_PROVIDER_SITE_OTHER): Payer: Medicare Other | Admitting: Urology

## 2015-03-09 ENCOUNTER — Encounter: Payer: Self-pay | Admitting: Urology

## 2015-03-09 VITALS — BP 175/115 | HR 80 | Ht 72.0 in | Wt 234.5 lb

## 2015-03-09 DIAGNOSIS — N390 Urinary tract infection, site not specified: Secondary | ICD-10-CM

## 2015-03-09 DIAGNOSIS — R338 Other retention of urine: Secondary | ICD-10-CM

## 2015-03-09 LAB — URINALYSIS, COMPLETE
BILIRUBIN UA: NEGATIVE
Glucose, UA: NEGATIVE
Ketones, UA: NEGATIVE
Nitrite, UA: POSITIVE — AB
PH UA: 7 (ref 5.0–7.5)
Specific Gravity, UA: 1.02 (ref 1.005–1.030)
Urobilinogen, Ur: 0.2 mg/dL (ref 0.2–1.0)

## 2015-03-09 LAB — MICROSCOPIC EXAMINATION

## 2015-03-09 LAB — BLADDER SCAN AMB NON-IMAGING: Scan Result: 155

## 2015-03-09 MED ORDER — LIDOCAINE HCL 2 % EX GEL: 1.0000 "application " | Freq: Once | CUTANEOUS | Status: DC

## 2015-03-09 MED ORDER — CIPROFLOXACIN HCL 250 MG PO TABS: 250.0000 mg | ORAL_TABLET | Freq: Two times a day (BID) | ORAL | Status: DC

## 2015-03-09 MED ORDER — CIPROFLOXACIN HCL 500 MG PO TABS: 500.0000 mg | ORAL_TABLET | Freq: Once | ORAL | Status: DC

## 2015-03-09 NOTE — Progress Notes (Signed)
The patient presented today for cystoscopy and a flow rate. This was to evaluate his prostate for bladder outlet obstruction. He is complaining about urinary urgency and worsening incontinence recently. His urine demonstrates nitrite positive, bacteria, and white blood cells. I have canceled his cystoscopy, and opted to treat his urine today. I did send urine culture. I suspect the patient has a active urinary tract infection. I recommended that we treat him with ciprofloxacin 250 mg twice a day 10 days. The patient should return for cystoscopy in 7 days.

## 2015-03-09 NOTE — Progress Notes (Signed)
Bladder Scan Patient  void: 155 ml Performed By:K.Russell,CMA

## 2015-03-11 LAB — CULTURE, URINE COMPREHENSIVE

## 2015-03-16 ENCOUNTER — Ambulatory Visit: Payer: Medicare Other | Admitting: Urology

## 2015-03-16 DIAGNOSIS — N390 Urinary tract infection, site not specified: Secondary | ICD-10-CM

## 2015-03-16 MED ORDER — AMOXICILLIN-POT CLAVULANATE 875-125 MG PO TABS
1.0000 | ORAL_TABLET | Freq: Two times a day (BID) | ORAL | Status: DC
Start: 2015-03-16 — End: 2015-04-01

## 2015-03-16 NOTE — Progress Notes (Signed)
Pt comes in today for Cysto, but urinalysis shows he is still positive for UTI.  He was treated w/ Cipro from last visit, but urine culture shows its resistant to Cipro. I spoke w/ Dr. Apolinar JunesBrandon and Augmentin was sent to pt pharmacy for 7 days and Cysto rescheduled.

## 2015-03-17 LAB — MICROSCOPIC EXAMINATION
RBC, UA: NONE SEEN /hpf (ref 0–?)
WBC, UA: >30 /hpf — ABNORMAL HIGH (ref 0–?)

## 2015-03-17 LAB — URINALYSIS, COMPLETE
Bilirubin, UA: NEGATIVE
GLUCOSE, UA: NEGATIVE
KETONES UA: NEGATIVE
Nitrite, UA: POSITIVE — AB
PROTEIN UA: NEGATIVE
Specific Gravity, UA: >1.03 — ABNORMAL HIGH (ref 1.005–1.030)
Urobilinogen, Ur: 0.2 mg/dL (ref 0.2–1.0)
pH, UA: 7 (ref 5.0–7.5)

## 2015-03-30 ENCOUNTER — Other Ambulatory Visit: Payer: Medicare Other | Admitting: Urology

## 2015-04-01 ENCOUNTER — Encounter: Payer: Self-pay | Admitting: Urology

## 2015-04-01 ENCOUNTER — Ambulatory Visit (INDEPENDENT_AMBULATORY_CARE_PROVIDER_SITE_OTHER): Payer: Medicare Other | Admitting: Urology

## 2015-04-01 VITALS — BP 164/91 | HR 63 | Ht 72.0 in | Wt 235.0 lb

## 2015-04-01 DIAGNOSIS — N401 Enlarged prostate with lower urinary tract symptoms: Secondary | ICD-10-CM

## 2015-04-01 DIAGNOSIS — R3914 Feeling of incomplete bladder emptying: Secondary | ICD-10-CM

## 2015-04-01 DIAGNOSIS — N210 Calculus in bladder: Secondary | ICD-10-CM

## 2015-04-01 DIAGNOSIS — N390 Urinary tract infection, site not specified: Secondary | ICD-10-CM

## 2015-04-01 LAB — URINALYSIS, COMPLETE
BILIRUBIN UA: NEGATIVE
GLUCOSE, UA: NEGATIVE
KETONES UA: NEGATIVE
Leukocytes, UA: NEGATIVE
NITRITE UA: NEGATIVE
Protein, UA: NEGATIVE
RBC UA: NEGATIVE
SPEC GRAV UA: 1.025 (ref 1.005–1.030)
UUROB: 0.2 mg/dL (ref 0.2–1.0)
pH, UA: 5 (ref 5.0–7.5)

## 2015-04-01 LAB — MICROSCOPIC EXAMINATION
Bacteria, UA: NONE SEEN
RBC MICROSCOPIC, UA: NONE SEEN /HPF (ref 0–?)

## 2015-04-01 MED ORDER — CIPROFLOXACIN HCL 500 MG PO TABS
500.0000 mg | ORAL_TABLET | Freq: Once | ORAL | Status: AC
  Administered 2015-04-01: 500 mg via ORAL

## 2015-04-01 MED ORDER — LIDOCAINE HCL 2 % EX GEL
1.0000 "application " | Freq: Once | CUTANEOUS | Status: AC
  Administered 2015-04-01: 1 via URETHRAL

## 2015-04-01 NOTE — Progress Notes (Signed)
    Cystoscopy Procedure Note  Patient identification was confirmed, informed consent was obtained, and patient was prepped using Betadine solution.  Lidocaine jelly was administered per urethral meatus.    Preoperative abx where received prior to procedure.     Pre-Procedure: - Inspection reveals a normal caliber ureteral meatus.  Procedure: The flexible cystoscope was introduced without difficulty - No urethral strictures/lesions are present. - Enlarged prostate trilobar enlargement  - Elevated bladder neck - Bilateral ureteral orifices identified - Bladder mucosa  reveals no ulcers, tumors, or lesions - 2cm bladder stone - No trabeculation  Retroflexion shows 1.5-2cm intravesical prostatic protrusion   Post-Procedure: - Patient tolerated the procedure well  Plan: Schedule for cystolithalopaxy and HoLEP

## 2015-04-07 ENCOUNTER — Telehealth: Payer: Self-pay | Admitting: Radiology

## 2015-04-07 NOTE — Telephone Encounter (Signed)
Notified pt & his wife of surgery scheduled 05/18/15, pre-admit testing appt on 05/04/15 @9 :00 and to call day prior to surgery for arrival time to SDS. Wife voices understanding. Mailed Surgery and Pre-Admit Testing Information Sheet to pt at home address.

## 2015-05-04 ENCOUNTER — Telehealth: Payer: Self-pay | Admitting: Radiology

## 2015-05-04 ENCOUNTER — Inpatient Hospital Stay: Admission: RE | Admit: 2015-05-04 | Payer: Medicare Other | Source: Ambulatory Visit

## 2015-05-04 NOTE — Telephone Encounter (Signed)
Notified pt he missed his pre-admit testing appt on 05/04/15 & that it is r/s for 2/2 :00. Pt & wife voice understanding.

## 2015-05-06 ENCOUNTER — Encounter
Admission: RE | Admit: 2015-05-06 | Discharge: 2015-05-06 | Disposition: A | Discharge status: Home / Self Care | Payer: Medicare HMO | Source: Ambulatory Visit | Attending: Urology | Admitting: Urology

## 2015-05-06 DIAGNOSIS — Z01812 Encounter for preprocedural laboratory examination: Secondary | ICD-10-CM | POA: Insufficient documentation

## 2015-05-06 DIAGNOSIS — Z01810 Encounter for preprocedural cardiovascular examination: Secondary | ICD-10-CM | POA: Diagnosis not present

## 2015-05-06 LAB — DIFFERENTIAL
BASOS PCT: 0 %
Basophils Absolute: 0 10*3/uL (ref 0–0.1)
EOS PCT: 5 %
Eosinophils Absolute: 0.3 10*3/uL (ref 0–0.7)
LYMPHS PCT: 30 %
Lymphs Abs: 1.6 10*3/uL (ref 1.0–3.6)
MONO ABS: 0.6 10*3/uL (ref 0.2–1.0)
Monocytes Relative: 12 %
NEUTROS ABS: 2.8 10*3/uL (ref 1.4–6.5)
NEUTROS PCT: 53 %

## 2015-05-06 LAB — BASIC METABOLIC PANEL
Anion gap: 7 (ref 5–15)
BUN: 17 mg/dL (ref 6–20)
CHLORIDE: 104 mmol/L (ref 101–111)
CO2: 28 mmol/L (ref 22–32)
CREATININE: 1.53 mg/dL — AB (ref 0.61–1.24)
Calcium: 9.8 mg/dL (ref 8.9–10.3)
GFR, EST AFRICAN AMERICAN: 48 mL/min — AB (ref >60)
GFR, EST NON AFRICAN AMERICAN: 41 mL/min — AB (ref >60)
Glucose, Bld: 84 mg/dL (ref 65–99)
POTASSIUM: 3.3 mmol/L — AB (ref 3.5–5.1)
SODIUM: 139 mmol/L (ref 135–145)

## 2015-05-06 LAB — CBC
HEMATOCRIT: 44.7 % (ref 40.0–52.0)
Hemoglobin: 14.8 g/dL (ref 13.0–18.0)
MCH: 28.4 pg (ref 26.0–34.0)
MCHC: 33 g/dL (ref 32.0–36.0)
MCV: 85.9 fL (ref 80.0–100.0)
PLATELETS: 240 10*3/uL (ref 150–440)
RBC: 5.21 MIL/uL (ref 4.40–5.90)
RDW: 13.8 % (ref 11.5–14.5)
WBC: 5.3 10*3/uL (ref 3.8–10.6)

## 2015-05-06 NOTE — Patient Instructions (Signed)
  Your procedure is scheduled on: Tuesday May 18, 2015. Report to Same Day Surgery. To find out your arrival time please call [phone] between 1PM - 3PM on Monday Feb. 13, 2017.  Remember: Instructions that are not followed completely may result in serious medical risk, up to and including death, or upon the discretion of your surgeon and anesthesiologist your surgery may need to be rescheduled.    _x___ 1. Do not eat food or drink liquids after midnight. No gum chewing or hard candies.     ____ 2. No Alcohol for 24 hours before or after surgery.   ____ 3. Bring all medications with you on the day of surgery if instructed.    __x__ 4. Notify your doctor if there is any change in your medical condition     (cold, fever, infections).     Do not wear jewelry, make-up, hairpins, clips or nail polish.  Do not wear lotions, powders, or perfumes. You may wear deodorant.  Do not shave 48 hours prior to surgery. Men may shave face and neck.  Do not bring valuables to the hospital.    [facility] is not responsible for any belongings or valuables.               Contacts, dentures or bridgework may not be worn into surgery.  Leave your suitcase in the car. After surgery it may be brought to your room.  For patients admitted to the hospital, discharge time is determined by your treatment team.   Patients discharged the day of surgery will not be allowed to drive home.    Please read over the following fact sheets that you were given:   [facility] Preparing for Surgery  _x___ Take these medicines the morning of surgery with A SIP OF WATER:    1. amLODipine (NORVASC)   2. losartan (COZAAR)     ____ Fleet Enema (as directed)   ____ Use CHG Soap as directed  ____ Use inhalers on the day of surgery  ____ Stop metformin 2 days prior to surgery    ____ Take 1/2 of usual insulin dose the night before surgery and none on the morning of  surgery.   ____ Stop Coumadin/Plavix/aspirin on  does not apply.  _x___ Stop Anti-inflammatories such as ibuprofen, Motrin, Aleve, goody's now.  Tylenol is OK to take for pain.   ____ Stop supplements until after surgery.    ____ Bring C-Pap to the hospital.

## 2015-05-06 NOTE — Pre-Procedure Instructions (Addendum)
Pt was unable to sit still for EKG, pt has dementia.  Dr. Randa Ngo reviewed today's EKG with 3 prior EKG's, no change, ok to proceed.

## 2015-06-08 ENCOUNTER — Telehealth: Payer: Self-pay | Admitting: Radiology

## 2015-06-08 NOTE — Telephone Encounter (Signed)
Pt's wife called with questions regarding surgery. Reminded her that surgery is scheduled for 06/15/15 and that they will need to call day prior to surgery for arrival time to SDS & pt should be npo after mn except for taking norvasc & cozaar with a sip of water the morning of surgery. Also pre-admit testing is going to follow up with them on 3/10 between 9am-1pm with a phone interview. Lafonda MossesDiana voices understanding.

## 2015-06-11 ENCOUNTER — Other Ambulatory Visit: Payer: Medicare Other

## 2015-06-11 NOTE — Pre-Procedure Instructions (Signed)
SPOKE WITH AMY AT DR Assunta GamblesBRANDONS OFFICE REGARDING K+FROM 05-06-15 (3.3) THAT LOOKS LIKE WAS NEVER ADDRESSED.  THE PTS WIFE SAID HE WOULD NOT BE ABLE TO COME IN ON Monday(06-14-15) TO HAVE IT RECHECKED. I TOLD AMY TO RUN THIS BY DR Apolinar JunesBRANDON TO SEE IF SHE MAYBE WANTS TO ORDER A K+SUPPLEMENT SINCE PT WAS NEVER STARTED ON ONE OR JUST WAIT AND SEE WHAT K+ IS ON Tuesday.

## 2015-06-11 NOTE — Patient Instructions (Signed)
  Your procedure is scheduled on: 06-15-15  Report [facility] 2ND FLOOR To find out your arrival time please call [phone] between 1PM - 3PM on 06-14-15  Remember: Instructions that are not followed completely may result in serious medical risk, up to and including death, or upon the discretion of your surgeon and anesthesiologist your surgery may need to be rescheduled.    _X___ 1. Do not eat food or drink liquids after midnight. No gum chewing or hard candies.     _X___ 2. No Alcohol for 24 hours before or after surgery.   ____ 3. Bring all medications with you on the day of surgery if instructed.    ____ 4. Notify your doctor if there is any change in your medical condition     (cold, fever, infections).     Do not wear jewelry, make-up, hairpins, clips or nail polish.  Do not wear lotions, powders, or perfumes. You may wear deodorant.  Do not shave 48 hours prior to surgery. Men may shave face and neck.  Do not bring valuables to the hospital.    [facility] is not responsible for any belongings or valuables.               Contacts, dentures or bridgework may not be worn into surgery.  Leave your suitcase in the car. After surgery it may be brought to your room.  For patients admitted to the hospital, discharge time is determined by your treatment team.   Patients discharged the day of surgery will not be allowed to drive home.   Please read over the following fact sheets that you were given:    _X___ Take these medicines the morning of surgery with A SIP OF WATER:    1. LOSARTAN  2. AMLODIPINE  3.   4.  5.  6.  ____ Fleet Enema (as directed)   ____ Use CHG Soap as directed  ____ Use inhalers on the day of surgery  ____ Stop metformin 2 days prior to surgery    ____ Take 1/2 of usual insulin dose the night before surgery and none on the morning of surgery.   ____ Stop Coumadin/Plavix/aspirin-N/A  ____ Stop Anti-inflammatories-NO NSAIDS  OR ASA PRODUCTS-TYLENOL OK TO TAKE   ____ Stop supplements until after surgery.    ____ Bring C-Pap to the hospital.

## 2015-06-14 ENCOUNTER — Telehealth: Payer: Self-pay | Admitting: Radiology

## 2015-06-14 NOTE — Telephone Encounter (Signed)
Dr Apolinar JunesBrandon aware of potassium level of 3.3 on 05/06/15. Per Dr Apolinar JunesBrandon, ok to proceed with surgery scheduled 06/15/15 without supplement. Will recheck potassium level day of surgery.

## 2015-06-14 NOTE — Pre-Procedure Instructions (Signed)
Received a call from Dr. Delana MeyerBrandon's office, No potassium supplement to be given per Amy.

## 2015-06-15 ENCOUNTER — Encounter: Payer: Self-pay | Admitting: Anesthesiology

## 2015-06-15 ENCOUNTER — Ambulatory Visit: Payer: Medicare Other | Admitting: Anesthesiology

## 2015-06-15 ENCOUNTER — Ambulatory Visit
Admission: RE | Admit: 2015-06-15 | Discharge: 2015-06-15 | Disposition: A | Discharge status: Home / Self Care | Payer: Medicare Other | Source: Ambulatory Visit | Attending: Urology | Admitting: Urology

## 2015-06-15 ENCOUNTER — Encounter
Admission: RE | Disposition: A | Discharge status: Home / Self Care | Payer: Self-pay | Source: Ambulatory Visit | Attending: Urology

## 2015-06-15 DIAGNOSIS — F329 Major depressive disorder, single episode, unspecified: Secondary | ICD-10-CM | POA: Diagnosis not present

## 2015-06-15 DIAGNOSIS — R339 Retention of urine, unspecified: Secondary | ICD-10-CM | POA: Insufficient documentation

## 2015-06-15 DIAGNOSIS — K219 Gastro-esophageal reflux disease without esophagitis: Secondary | ICD-10-CM | POA: Diagnosis not present

## 2015-06-15 DIAGNOSIS — R972 Elevated prostate specific antigen [PSA]: Secondary | ICD-10-CM | POA: Diagnosis not present

## 2015-06-15 DIAGNOSIS — N3289 Other specified disorders of bladder: Secondary | ICD-10-CM | POA: Insufficient documentation

## 2015-06-15 DIAGNOSIS — N138 Other obstructive and reflux uropathy: Secondary | ICD-10-CM

## 2015-06-15 DIAGNOSIS — J45909 Unspecified asthma, uncomplicated: Secondary | ICD-10-CM | POA: Insufficient documentation

## 2015-06-15 DIAGNOSIS — F0390 Unspecified dementia without behavioral disturbance: Secondary | ICD-10-CM | POA: Diagnosis not present

## 2015-06-15 DIAGNOSIS — I129 Hypertensive chronic kidney disease with stage 1 through stage 4 chronic kidney disease, or unspecified chronic kidney disease: Secondary | ICD-10-CM | POA: Diagnosis not present

## 2015-06-15 DIAGNOSIS — Z8042 Family history of malignant neoplasm of prostate: Secondary | ICD-10-CM | POA: Diagnosis not present

## 2015-06-15 DIAGNOSIS — Z87891 Personal history of nicotine dependence: Secondary | ICD-10-CM | POA: Diagnosis not present

## 2015-06-15 DIAGNOSIS — Z87440 Personal history of urinary (tract) infections: Secondary | ICD-10-CM | POA: Insufficient documentation

## 2015-06-15 DIAGNOSIS — N189 Chronic kidney disease, unspecified: Secondary | ICD-10-CM | POA: Insufficient documentation

## 2015-06-15 DIAGNOSIS — H409 Unspecified glaucoma: Secondary | ICD-10-CM | POA: Diagnosis not present

## 2015-06-15 DIAGNOSIS — E785 Hyperlipidemia, unspecified: Secondary | ICD-10-CM | POA: Insufficient documentation

## 2015-06-15 DIAGNOSIS — N210 Calculus in bladder: Secondary | ICD-10-CM

## 2015-06-15 DIAGNOSIS — Z79899 Other long term (current) drug therapy: Secondary | ICD-10-CM | POA: Diagnosis not present

## 2015-06-15 DIAGNOSIS — N401 Enlarged prostate with lower urinary tract symptoms: Secondary | ICD-10-CM

## 2015-06-15 DIAGNOSIS — R3914 Feeling of incomplete bladder emptying: Secondary | ICD-10-CM

## 2015-06-15 HISTORY — PX: CYSTOSCOPY WITH LITHOLAPAXY: SHX1425

## 2015-06-15 HISTORY — PX: HOLEP-LASER ENUCLEATION OF THE PROSTATE WITH MORCELLATION: SHX6641

## 2015-06-15 LAB — POCT I-STAT 4, (NA,K, GLUC, HGB,HCT)
GLUCOSE: 93 mg/dL (ref 65–99)
HCT: 47 % (ref 39.0–52.0)
Hemoglobin: 16 g/dL (ref 13.0–17.0)
Potassium: 3.4 mmol/L — ABNORMAL LOW (ref 3.5–5.1)
Sodium: 140 mmol/L (ref 135–145)

## 2015-06-15 SURGERY — ENUCLEATION, PROSTATE, USING LASER, WITH MORCELLATION
Anesthesia: General | Wound class: Clean Contaminated

## 2015-06-15 MED ORDER — EPHEDRINE SULFATE 50 MG/ML IJ SOLN
INTRAMUSCULAR | Status: DC | PRN
  Administered 2015-06-15 (×2): 10 mg via INTRAVENOUS
  Administered 2015-06-15 (×2): 5 mg via INTRAVENOUS

## 2015-06-15 MED ORDER — HYDROCODONE-ACETAMINOPHEN 5-325 MG PO TABS: 1.0000 | ORAL_TABLET | Freq: Four times a day (QID) | ORAL | Status: DC | PRN

## 2015-06-15 MED ORDER — FAMOTIDINE 20 MG PO TABS
20.0000 mg | ORAL_TABLET | Freq: Once | ORAL | Status: AC
  Administered 2015-06-15: 20 mg via ORAL

## 2015-06-15 MED ORDER — CEFAZOLIN SODIUM-DEXTROSE 2-3 GM-% IV SOLR
INTRAVENOUS | Status: AC
  Filled 2015-06-15: qty 50

## 2015-06-15 MED ORDER — FUROSEMIDE 10 MG/ML IJ SOLN
INTRAMUSCULAR | Status: DC | PRN
  Administered 2015-06-15: 10 mg via INTRAVENOUS

## 2015-06-15 MED ORDER — LACTATED RINGERS IV SOLN
INTRAVENOUS | Status: DC
  Administered 2015-06-15: 08:00:00 via INTRAVENOUS

## 2015-06-15 MED ORDER — ONDANSETRON HCL 4 MG/2ML IJ SOLN
INTRAMUSCULAR | Status: DC | PRN
  Administered 2015-06-15: 4 mg via INTRAVENOUS

## 2015-06-15 MED ORDER — DEXTROSE 5 % IV SOLN
2.0000 g | Freq: Once | INTRAVENOUS | Status: AC
  Administered 2015-06-15: 2 g via INTRAVENOUS
  Filled 2015-06-15: qty 2

## 2015-06-15 MED ORDER — LIDOCAINE HCL (CARDIAC) 20 MG/ML IV SOLN
INTRAVENOUS | Status: DC | PRN
  Administered 2015-06-15: 80 mg via INTRAVENOUS

## 2015-06-15 MED ORDER — FENTANYL CITRATE (PF) 100 MCG/2ML IJ SOLN: 25.0000 ug | INTRAMUSCULAR | Status: DC | PRN

## 2015-06-15 MED ORDER — FAMOTIDINE 20 MG PO TABS
ORAL_TABLET | ORAL | Status: AC
  Filled 2015-06-15: qty 1

## 2015-06-15 MED ORDER — NEOSTIGMINE METHYLSULFATE 10 MG/10ML IV SOLN
INTRAVENOUS | Status: DC | PRN
  Administered 2015-06-15: 4 mg via INTRAVENOUS

## 2015-06-15 MED ORDER — ONDANSETRON HCL 4 MG/2ML IJ SOLN: 4.0000 mg | Freq: Once | INTRAMUSCULAR | Status: DC | PRN

## 2015-06-15 MED ORDER — GLYCOPYRROLATE 0.2 MG/ML IJ SOLN
INTRAMUSCULAR | Status: DC | PRN
  Administered 2015-06-15: .6 mg via INTRAVENOUS

## 2015-06-15 MED ORDER — PROPOFOL 10 MG/ML IV BOLUS
INTRAVENOUS | Status: DC | PRN
  Administered 2015-06-15: 150 mg via INTRAVENOUS
  Administered 2015-06-15: 50 mg via INTRAVENOUS

## 2015-06-15 MED ORDER — DOCUSATE SODIUM 100 MG PO CAPS: 100.0000 mg | ORAL_CAPSULE | Freq: Two times a day (BID) | ORAL | Status: AC

## 2015-06-15 MED ORDER — ROCURONIUM BROMIDE 100 MG/10ML IV SOLN
INTRAVENOUS | Status: DC | PRN
  Administered 2015-06-15: 10 mg via INTRAVENOUS
  Administered 2015-06-15: 30 mg via INTRAVENOUS
  Administered 2015-06-15: 20 mg via INTRAVENOUS

## 2015-06-15 MED ORDER — PHENYLEPHRINE HCL 10 MG/ML IJ SOLN
INTRAMUSCULAR | Status: DC | PRN
  Administered 2015-06-15 (×6): 100 ug via INTRAVENOUS

## 2015-06-15 MED ORDER — DEXAMETHASONE SODIUM PHOSPHATE 10 MG/ML IJ SOLN
INTRAMUSCULAR | Status: DC | PRN
  Administered 2015-06-15: 5 mg via INTRAVENOUS

## 2015-06-15 MED ORDER — OXYBUTYNIN CHLORIDE 5 MG PO TABS: 5.0000 mg | ORAL_TABLET | Freq: Three times a day (TID) | ORAL | Status: DC | PRN

## 2015-06-15 MED ORDER — FENTANYL CITRATE (PF) 100 MCG/2ML IJ SOLN
INTRAMUSCULAR | Status: DC | PRN
  Administered 2015-06-15 (×3): 50 ug via INTRAVENOUS

## 2015-06-15 SURGICAL SUPPLY — 42 items
ADAPTER IRRIG TUBE 2 SPIKE SOL (ADAPTER) ×6 IMPLANT
BAG DRAIN CYSTO-URO LG1000N (MISCELLANEOUS) ×3 IMPLANT
BAG URO DRAIN 2000ML W/SPOUT (MISCELLANEOUS) ×3 IMPLANT
BAG URO DRAIN 4000ML (MISCELLANEOUS) ×3 IMPLANT
BASKET ZERO TIP 1.9FR (BASKET) ×3 IMPLANT
CATH FOL 2WAY LX 20X30 (CATHETERS) ×3 IMPLANT
CATH FOL LEG HOLDER (MISCELLANEOUS) ×3 IMPLANT
CATH FOLEY 3WAY 30CC 22FR (CATHETERS) IMPLANT
CATH URETL 5X70 OPEN END (CATHETERS) ×3 IMPLANT
CNTNR SPEC 2.5X3XGRAD LEK (MISCELLANEOUS) ×1
CONT SPEC 4OZ STER OR WHT (MISCELLANEOUS) ×2
CONTAINER COLLECT MORCELLATR (MISCELLANEOUS) ×1 IMPLANT
CONTAINER SPEC 2.5X3XGRAD LEK (MISCELLANEOUS) ×1 IMPLANT
DRAPE SHEET LG 3/4 BI-LAMINATE (DRAPES) ×3 IMPLANT
DRAPE UTILITY 15X26 TOWEL STRL (DRAPES) ×3 IMPLANT
FILTER OVERFLOW MORCELLATOR (FILTER) ×1 IMPLANT
GLOVE BIO SURGEON STRL SZ 6.5 (GLOVE) ×2 IMPLANT
GLOVE BIO SURGEON STRL SZ7 (GLOVE) ×6 IMPLANT
GLOVE BIO SURGEONS STRL SZ 6.5 (GLOVE) ×1
GOWN STRL REUS W/ TWL LRG LVL3 (GOWN DISPOSABLE) ×2 IMPLANT
GOWN STRL REUS W/TWL LRG LVL3 (GOWN DISPOSABLE) ×4
HOLMIUM LASER 550 FIBER (Laser) ×3 IMPLANT
IV SOD CHL 0.9% 1000ML (IV SOLUTION) ×75 IMPLANT
KIT RM TURNOVER CYSTO AR (KITS) ×3 IMPLANT
MORCELLATOR COLLECT CONTAINER (MISCELLANEOUS) ×3
MORCELLATOR OVERFLOW FILTER (FILTER) ×3
MORCELLATOR ROTATION 4.75 335 (MISCELLANEOUS) ×3 IMPLANT
PACK CYSTO AR (MISCELLANEOUS) ×3 IMPLANT
PREP PVP WINGED SPONGE (MISCELLANEOUS) ×3 IMPLANT
PUMP SINGLE ACTION SAP (PUMP) IMPLANT
SENSORWIRE 0.038 NOT ANGLED (WIRE) ×6
SET CYSTO W/LG BORE CLAMP LF (SET/KITS/TRAYS/PACK) ×3 IMPLANT
SET IRRIG Y TYPE TUR BLADDER L (SET/KITS/TRAYS/PACK) ×3 IMPLANT
SOL .9 NS 3000ML IRR  AL (IV SOLUTION) ×4
SOL .9 NS 3000ML IRR UROMATIC (IV SOLUTION) ×2 IMPLANT
SYRINGE IRR TOOMEY STRL 70CC (SYRINGE) ×3 IMPLANT
TUBE PUMP MORCELLATOR PIRANHA (TUBING) ×3 IMPLANT
TUBING CONNECTING 10 (TUBING) ×2 IMPLANT
TUBING CONNECTING 10' (TUBING) ×1
WATER STERILE IRR 1000ML POUR (IV SOLUTION) ×3 IMPLANT
WATER STERILE IRR 3000ML UROMA (IV SOLUTION) IMPLANT
WIRE SENSOR 0.038 NOT ANGLED (WIRE) ×2 IMPLANT

## 2015-06-15 NOTE — Transfer of Care (Signed)
Immediate Anesthesia Transfer of Care Note  Patient: Duane Ochoa  Procedure(s) Performed: Procedure(s): HOLEP-LASER ENUCLEATION OF THE PROSTATE WITH MORCELLATION (N/A) CYSTOSCOPY WITH LITHOLAPAXY (N/A)  Patient Location: PACU  Anesthesia Type:General  Level of Consciousness: awake, alert , oriented and patient cooperative  Airway & Oxygen Therapy: Patient Spontanous Breathing and Patient connected to face mask oxygen  Post-op Assessment: Report given to RN, Post -op Vital signs reviewed and stable and Patient moving all extremities X 4  Post vital signs: Reviewed and stable  Last Vitals:  Filed Vitals:   06/15/15 0717 06/15/15 1057  BP: 145/79 143/82  Pulse: 85 105  Temp: 37.1 C 36.9 C  Resp: 12 18    Complications: No apparent anesthesia complications

## 2015-06-15 NOTE — Op Note (Signed)
Date of procedure: 06/15/2015  Preoperative diagnosis:  1. BPH with bladder outlet obstruction 2. Bladder stones 3. History of urinary retention   Postoperative diagnosis:  1. Same as above   Procedure: 1. Cystolitholapaxy 2. Holmium laser enucleation of the prostate with morcellation  Surgeon: Hollice Espy, MD  Anesthesia: General  Complications: None  Intraoperative findings: 2 cm bladder stone with mildly trabeculated bladder, elevated bladder neck with intravesical protrusion of the median lobe.  EBL: Minimal  Specimens: Prostate chips  Drains: 19 French two-way Foley catheter with 30 cc balloon  Indication: Duane Ochoa is a 82 y.o. patient with history of urinary retention, bladder stones, recurrent urinary tract infections, and BPH with bladder outlet obstruction.  Cystoscopically, he is found to have a bladder stone as well as a significantly elevated bladder neck and median lobe component to his prostate. Prostate volume roughly 90 cc by transabdominal ultrasound. He is currently on finasteride and Flomax..  After reviewing the management options for treatment, he elected to proceed with the above surgical procedure(s). We have discussed the potential benefits and risks of the procedure, side effects of the proposed treatment, the likelihood of the patient achieving the goals of the procedure, and any potential problems that might occur during the procedure or recuperation. Informed consent has been obtained.  Description of procedure:  The patient was taken to the operating room and general anesthesia was induced.  The patient was placed in the dorsal lithotomy position, prepped and draped in the usual sterile fashion, and preoperative antibiotics were administered. A preoperative time-out was performed.   A rigid 33 French resectoscope was advanced per urethra into the bladder using the metal obturator. The scope was then brought into the bladder and prostatic fossa  were inspected. This revealed trilobar coaptation of the prostate in approximately 5-6 cm prostatic length with 1-2 cm intravesical prostatic protrusion with an elevated bladder neck. The bladder itself was mildly trabeculated. Bilateral UOs could be identified but with some difficulty due to the elevated bladder neck that were sufficient distance from this. A 2 cm bladder stone was identified within the bladder. At this point in time, 550  laser fiber was brought in and using the settings of 2 J and 20 Hz, the stone was fragmented into very small particles and evacuated from the bladder without difficulty.  Next, using settings of 2 J and 40 Hz, 2 incisions were created at the bladder neck one approximately in the 5:00 and one in the 7:00 positions. This incision was carried down just proximal to the verumontanum where they met in the midline. The median lobe adenoma was then separated from the underlying capsule working from a caudal to cranial direction until the median lobe was attached only at the bladder neck by mucosa. Once this mucosa was incised, the median lobe was freed and pushed into the bladder. Next, attention was turned to the enucleation of the left lateral lobe. A curvilinear incision was made at the apex of the left prostatic lobe again with care taken to avoid any resection beyond the verumontanum. The adenoma was again separated from the underlying capsule using the holmium laser. The adenoma was rolled in a caudal to cranial direction until 0 freed up from the bladder neck. A small bridge of tissue at the anterior commissure was identified and transected freeing the adenoma pushing into the bladder. Of note, some prostate tissue was left at the apex to help with postoperative incontinence and urinary control. The same exact procedure  was performed to free the right lateral lobe. Again, most but not all of the lateral lobe was resected given the patient's history of urinary urgency and urge  incontinence. Hemostasis was adequate at this point. The resectoscope was then exchanged for the nephroscope revealed a 26 French outer sheath in place. A Piranha morcellator was brought in and once the bladder was filled to maximum capacity, the prostatic tissue was morcellated with care taken to avoid any injury to the underlying bladder. The bladder was then irrigated and no residual fragments of tissue were stones were identified. The UOs were carefully inspected at which time clear reflux of urine was noted from each. The patient was administered 10 of IV Lasix. The scope was removed. A 20 French two-way Foley catheter was advanced per urethra into the bladder with aid of a catheter guide. The bladder is irrigated to confirm adequate position. The balloon was then filled with 30 cc of sterile water. The patient was then cleaned and dried. The catheter was secured to the patient's left leg ascending catheter secure. He was then reversed from anesthesia and taken to the PACU in stable condition.  Plan: Patient will remove his catheter in 2 days. He'll return to the office in 6 weeks to reassess his voiding symptoms. We will stop his finasteride and start when necessary anticholinergic.   Hollice Espy, M.D.

## 2015-06-15 NOTE — Discharge Instructions (Signed)
Foley Catheter Care, Adult A Foley catheter is a soft, flexible tube. This tube is placed into your bladder to drain pee (urine). If you go home with this catheter in place, follow the instructions below. TAKING CARE OF THE CATHETER 1. Wash your hands with soap and water. 2. Put soap and water on a clean washcloth.  Clean the skin where the tube goes into your body.  Clean away from the tube site.  Never wipe toward the tube.  Clean the area using a circular motion.  Remove all the soap. Pat the area dry with a clean towel. For males, reposition the skin that covers the end of the penis (foreskin). 3. Attach the tube to your leg with tape or a leg strap. Do not stretch the tube tight. If you are using tape, remove any stickiness left behind by past tape you used. 4. Keep the drainage bag below your hips. Keep it off the floor. 5. Check your tube during the day. Make sure it is working and draining. Make sure the tube does not curl, twist, or bend. 6. Do not pull on the tube or try to take it out. TAKING CARE OF THE DRAINAGE BAGS You will have a large overnight drainage bag and a small leg bag. You may wear the overnight bag any time. Never wear the small bag at night. Follow the directions below. Emptying the Drainage Bag Empty your drainage bag when it is  - full or at least 2-3 times a day. 1. Wash your hands with soap and water. 2. Keep the drainage bag below your hips. 3. Hold the dirty bag over the toilet or clean container. 4. Open the pour spout at the bottom of the bag. Empty the pee into the toilet or container. Do not let the pour spout touch anything. 5. Clean the pour spout with a gauze pad or cotton ball that has rubbing alcohol on it. 6. Close the pour spout. 7. Attach the bag to your leg with tape or a leg strap. 8. Wash your hands well. Changing the Drainage Bag Change your bag once a month or sooner if it starts to smell or look dirty.  1. Wash your hands with soap  and water. 2. Pinch the rubber tube so that pee does not spill out. 3. Disconnect the catheter tube from the drainage tube at the connection valve. Do not let the tubes touch anything. 4. Clean the end of the catheter tube with an alcohol wipe. Clean the end of a the drainage tube with a different alcohol wipe. 5. Connect the catheter tube to the drainage tube of the clean drainage bag. 6. Attach the new bag to the leg with tape or a leg strap. Avoid attaching the new bag too tightly. 7. Wash your hands well. Cleaning the Drainage Bag 1. Wash your hands with soap and water. 2. Wash the bag in warm, soapy water. 3. Rinse the bag with warm water. 4. Fill the bag with a mixture of white vinegar and water (1 cup vinegar to 1 quart warm water [.2 liter vinegar to 1 liter warm water]). Close the bag and soak it for 30 minutes in the solution. 5. Rinse the bag with warm water. 6. Hang the bag to dry with the pour spout open and hanging downward. 7. Store the clean bag (once it is dry) in a clean plastic bag. 8. Wash your hands well. PREVENT INFECTION  Wash your hands before and after touching your tube.  Take showers every day. Wash the skin where the tube enters your body. Do not take baths. Replace wet leg straps with dry ones, if this applies.  Do not use powders, sprays, or lotions on the genital area. Only use creams, lotions, or ointments as told by your doctor.  For females, wipe from front to back after going to the bathroom.  Drink enough fluids to keep your pee clear or pale yellow unless you are told not to have too much fluid (fluid restriction).  Do not let the drainage bag or tubing touch or lie on the floor.  Wear cotton underwear to keep the area dry. GET HELP IF:  Your pee is cloudy or smells unusually bad.  Your tube becomes clogged.  You are not draining pee into the bag or your bladder feels full.  Your tube starts to leak. GET HELP RIGHT AWAY IF:  You have  pain, puffiness (swelling), redness, or yellowish-white fluid (pus) where the tube enters the body.  You have pain in the belly (abdomen), legs, lower back, or bladder.  You have a fever.  You see blood fill the tube, or your pee is pink or red.  You feel sick to your stomach (nauseous), throw up (vomit), or have chills.  Your tube gets pulled out. MAKE SURE YOU:   Understand these instructions.  Will watch your condition.  Will get help right away if you are not doing well or get worse.    Your catheter will need removed on  THURSDAY. A nurse at Dr. Delana MeyerBrandon's office will remove the catheter on Thursday, 06/17/15 at 8:45 am.   AMBULATORY SURGERY  DISCHARGE INSTRUCTIONS   1) The drugs that you were given will stay in your system until tomorrow so for the next 24 hours you should not:  A) Drive an automobile B) Make any legal decisions C) Drink any alcoholic beverage   2) You may resume regular meals tomorrow.  Today it is better to start with liquids and gradually work up to solid foods.  You may eat anything you prefer, but it is better to start with liquids, then soup and crackers, and gradually work up to solid foods.   3) Please notify your doctor immediately if you have any unusual bleeding, trouble breathing, redness and pain at the surgery site, drainage, fever, or pain not relieved by medication.    Please contact your physician with any problems or Same Day Surgery at [phone], Monday through Friday 6 am to 4 pm, or [place] at [facility] Main number at [phone].

## 2015-06-15 NOTE — Anesthesia Preprocedure Evaluation (Signed)
Anesthesia Evaluation  Patient identified by MRN, date of birth, ID band Patient awake    Reviewed: Allergy & Precautions, NPO status , Patient's Chart, lab work & pertinent test results, reviewed documented beta blocker date and time   Airway Mallampati: III  TM Distance: >3 FB     Dental  (+) Chipped   Pulmonary asthma , former smoker,           Cardiovascular hypertension, Pt. on medications and Pt. on home beta blockers      Neuro/Psych PSYCHIATRIC DISORDERS Depression    GI/Hepatic GERD  ,  Endo/Other    Renal/GU Renal InsufficiencyRenal disease     Musculoskeletal   Abdominal   Peds  Hematology   Anesthesia Other Findings   Reproductive/Obstetrics                             Anesthesia Physical Anesthesia Plan  ASA: III  Anesthesia Plan: General   Post-op Pain Management:    Induction: Intravenous  Airway Management Planned: Oral ETT and LMA  Additional Equipment:   Intra-op Plan:   Post-operative Plan:   Informed Consent: I have reviewed the patients History and Physical, chart, labs and discussed the procedure including the risks, benefits and alternatives for the proposed anesthesia with the patient or authorized representative who has indicated his/her understanding and acceptance.     Plan Discussed with: CRNA  Anesthesia Plan Comments:         Anesthesia Quick Evaluation

## 2015-06-15 NOTE — Anesthesia Postprocedure Evaluation (Signed)
Anesthesia Post Note  Patient: Duane Ochoa  Procedure(s) Performed: Procedure(s) (LRB): HOLEP-LASER ENUCLEATION OF THE PROSTATE WITH MORCELLATION (N/A) CYSTOSCOPY WITH LITHOLAPAXY (N/A)  Patient location during evaluation: PACU Anesthesia Type: General Level of consciousness: awake and alert Pain management: pain level controlled Vital Signs Assessment: post-procedure vital signs reviewed and stable Respiratory status: spontaneous breathing, nonlabored ventilation, respiratory function stable and patient connected to nasal cannula oxygen Cardiovascular status: blood pressure returned to baseline and stable Postop Assessment: no signs of nausea or vomiting Anesthetic complications: no    Last Vitals:  Filed Vitals:   06/15/15 1202 06/15/15 1221  BP: 131/61 112/58  Pulse: 81 71  Temp: 35.7 C   Resp: 18 18    Last Pain:  Filed Vitals:   06/15/15 1222  PainSc: Asleep                 Cooper Moroney S

## 2015-06-15 NOTE — Anesthesia Procedure Notes (Signed)
Procedure Name: Intubation Date/Time: 06/15/2015 8:53 AM Performed by: Michaele OfferSAVAGE, Jonelle Bann Pre-anesthesia Checklist: Patient identified, Emergency Drugs available, Suction available, Patient being monitored and Timeout performed Patient Re-evaluated:Patient Re-evaluated prior to inductionOxygen Delivery Method: Circle system utilized Preoxygenation: Pre-oxygenation with 100% oxygen Intubation Type: IV induction Ventilation: Oral airway inserted - appropriate to patient size Laryngoscope Size: Mac and 4 Grade View: Grade II Tube type: Oral Tube size: 7.5 mm Number of attempts: 1 Airway Equipment and Method: Rigid stylet Placement Confirmation: ETT inserted through vocal cords under direct vision,  positive ETCO2 and breath sounds checked- equal and bilateral Secured at: 20 cm Tube secured with: Tape Dental Injury: Teeth and Oropharynx as per pre-operative assessment

## 2015-06-15 NOTE — H&P (Signed)
06/15/15  BRIA PORTALES [birth year] [id number]  Referring provider: Titus Mould, NP [address] [place], [place] [address]  No chief complaint on file.   HPI:  1 - Lower Urinary Tract Symptoms / History of Urinary Retention - Episode retention 2016 req foley for volume, subsequently placed on tamsulosin + finasteride and passed trial of void 10/2014.  Cysto showed 2 cm intravesical protrusion of median lobe and 2 cm bladder stones.    2 - Recurrent UTI - two episodes cystitis 2016, Both polymicrobial. No recent upper tract imaging. PVR 2016 "0mL"  3 - Chronic Renal Insuficiency - Cr 2.2 noted on labs 2016. No prior renal imaging on file.   Today "Duane Ochoa" is seen in f/u above. He had the KUB and Renal US. No stones or hydro were seen but he was found to have a 2cm mass at the base of the bladder which could be prostatic but cystoscopy was recommended.   He has in his bladder today but couldn't give a specimen. He currently goes 4-5x nightly but it is not as bad at night. He has had no hematuria or dysuria.   PMH: Past Medical History  Diagnosis Date  . Hypertension   . GERD (gastroesophageal reflux disease)   . Asthma   . Depressed   . Glaucoma   . Hyperlipemia   . Elevated PSA   . BPH (benign prostatic hyperplasia)   . Dementia     Surgical History: Past Surgical History  Procedure Laterality Date  . None      Home Medications:    Medication List       This list is accurate as of: 12/08/14 10:46 AM. Always use your most recent med list.              amLODipine 10 MG tablet  Commonly known as: NORVASC  Take 10 mg by mouth daily.     ciprofloxacin 500 MG tablet  Commonly known as: CIPRO  Take 1 tablet (500 mg total) by mouth 2 (two) times daily.     finasteride 5 MG tablet  Commonly known as:  PROSCAR  Take 1 tablet (5 mg total) by mouth daily.     hydrochlorothiazide 25 MG tablet  Commonly known as: HYDRODIURIL  Take 25 mg by mouth daily.     simvastatin 20 MG tablet  Commonly known as: ZOCOR  Take 20 mg by mouth daily.     tamsulosin 0.4 MG Caps capsule  Commonly known as: FLOMAX  Take 1 capsule (0.4 mg total) by mouth daily.     tamsulosin 0.4 MG Caps capsule  Commonly known as: FLOMAX  Take 1 capsule (0.4 mg total) by mouth daily.        Allergies: No Known Allergies  Family History: Family History  Problem Relation Age of Onset  . Prostate cancer Brother     Social History: reports that he quit smoking about 20 years ago. He does not have any smokeless tobacco history on file. He reports that he does not drink alcohol or use illicit drugs.  ROS:    Review of Systems  Gastrointestinal (upper) : Negative for upper GI symptoms  Gastrointestinal (lower) : Negative for lower GI symptoms  Constitutional : Negative for symptoms  Skin: Negative for skin symptoms  Eyes: Negative for eye symptoms  Ear/Nose/Throat : Negative for Ear/Nose/Throat symptoms  Hematologic/Lymphatic: Negative for Hematologic/Lymphatic symptoms  Cardiovascular : Negative for cardiovascular symptoms  Respiratory : Negative for  respiratory symptoms  Endocrine: Negative for endocrine symptoms  Musculoskeletal: Negative for musculoskeletal symptoms  Neurological: Negative for neurological symptoms  Psychologic: Negative for psychiatric symptoms   Physical Exam: BP 169/71 mmHg  Pulse 72  Ht 6' (1.829 m)  Wt 227 lb 8 oz (103.193 kg)  BMI 30.85 kg/m2 .Physical Exam  Constitutional: He is oriented to person, place, and time. He appears well-developed and well-nourished.  Cardiovascular: Normal rate and regular rhythm.   Pulmonary/Chest: Effort normal and breath sounds normal.  Abdominal:  Soft. Bowel sounds are normal.  Musculoskeletal: Normal range of motion.  Neurological: He is alert and oriented to person, place, and time.  Skin: Skin is warm and dry.  Psychiatric: He has a normal mood and affect.     Laboratory Data: Lab Results  Component Value Date   WBC 10.3 09/22/2014   HGB 14.4 09/22/2014   HCT 43.5 09/22/2014   MCV 84.6 09/22/2014   PLT 161 09/22/2014    Lab Results  Component Value Date   CREATININE 2.27* 09/22/2014     Pertinent Imaging: KUB and renal US reports reviewed.   Assessment & Plan:    1 - Lower Urinary Tract Symptoms / History of Urinary Retention - Scheduled for cystolitholapaxy/ HoLEP for treatment of his BOO.  Risks and benefits discussed in preop again today including risk of post op incontinence, need for Foley, bleeding, infection, damage to surrounding structures.    2 - Recurrent UTI - he empties well. Still rec upper tract imaging with KUB/Renal US to r/o sig hydro or stones as nidus. Pt and his wife given number to schedule today. Ideally he will call to schedule and have these studies done soon to r/o additional modifiable factors for this or renal insuficiency.   3 - Chronic Renal Insuficiency - KUB, Renal US on return to r/o sig hydro. Though I suspect mostly medical renal.   Vanna ScotlandAshley Tarique Loveall   [facility] [address], [address] [place], [place] [address] [phone]

## 2015-06-16 LAB — SURGICAL PATHOLOGY

## 2015-06-17 ENCOUNTER — Encounter: Payer: Self-pay | Admitting: Urology

## 2015-06-17 ENCOUNTER — Ambulatory Visit (INDEPENDENT_AMBULATORY_CARE_PROVIDER_SITE_OTHER): Payer: Medicare Other | Admitting: Urology

## 2015-06-17 VITALS — BP 165/80 | HR 82 | Ht 72.0 in | Wt 229.1 lb

## 2015-06-17 DIAGNOSIS — Z9079 Acquired absence of other genital organ(s): Secondary | ICD-10-CM

## 2015-06-17 NOTE — Progress Notes (Signed)
06/17/2015 8:58 AM   Duane Ochoa 21-Feb-1933 [id number]  Referring provider: Titus Mould, NP [address] [place], [place] [address]  Chief Complaint  Patient presents with  . Other    voiding trial     HPI: Patient is an 82 year old African-American male who is status post cystolitholapaxy with HoLEP procedure on 06/15/2015 with Dr. Apolinar Junes.    Background history Patient was found to have urinary retention with 900 mL and required a foley catheter, he was subsequently placed on tamsulosin and finasteride and passed a voiding trial in July 2016.  He did undergo a cystoscopy on 04/01/2015 and was found to have a 2 cm intravesicular protrusion of the median lobe of the prostate and 2 cm bladder stones.  He did not demonstrate hydronephrosis on a renal ultrasound performed 01/11/2015.    He underwent cystolitholapaxy with HoLEP definitive treatment for the bladder stones and BPH.  Prostate chips were sent for pathology.    His postoperative course thus far has been uneventful. His Foley is draining clear yellow urine.  He is looking forward for its removal.   PMH: Past Medical History  Diagnosis Date  . Hypertension   . GERD (gastroesophageal reflux disease)   . Asthma   . Depressed   . Glaucoma   . Hyperlipemia   . Elevated PSA   . BPH (benign prostatic hyperplasia)   . Dementia   . Acute urinary retention 10/13/2014  . Benign prostatic hypertrophy with incomplete bladder emptying 02/15/2015  . Chronic kidney disease (CKD) 12/08/2014  . Recurrent UTI 12/08/2014    Surgical History: Past Surgical History  Procedure Laterality Date  . None    . No past surgeries    . Holep-laser enucleation of the prostate with morcellation N/A 06/15/2015    Procedure: HOLEP-LASER ENUCLEATION OF THE PROSTATE WITH MORCELLATION;  Surgeon: Vanna Scotland, MD;  Location: [facility] ORS;  Service: Urology;  Laterality: N/A;  . Cystoscopy with litholapaxy N/A 06/15/2015    Procedure:  CYSTOSCOPY WITH LITHOLAPAXY;  Surgeon: Vanna Scotland, MD;  Location: [facility] ORS;  Service: Urology;  Laterality: N/A;    Home Medications:    Medication List       This list is accurate as of: 06/17/15  8:58 AM.  Always use your most recent med list.               amLODipine 10 MG tablet  Commonly known as:  NORVASC  Take 10 mg by mouth every morning.     docusate sodium 100 MG capsule  Commonly known as:  COLACE  Take 1 capsule (100 mg total) by mouth 2 (two) times daily.     hydrochlorothiazide 25 MG tablet  Commonly known as:  HYDRODIURIL  Take 25 mg by mouth every morning.     HYDROcodone-acetaminophen 5-325 MG tablet  Commonly known as:  NORCO/VICODIN  Take 1-2 tablets by mouth every 6 (six) hours as needed for moderate pain.     losartan 25 MG tablet  Commonly known as:  COZAAR  25 mg every morning.     oxybutynin 5 MG tablet  Commonly known as:  DITROPAN  Take 1 tablet (5 mg total) by mouth every 8 (eight) hours as needed for bladder spasms.     tamsulosin 0.4 MG Caps capsule  Commonly known as:  FLOMAX  Take 1 capsule (0.4 mg total) by mouth daily.        Allergies: No Known Allergies  Family History: Family History  Problem Relation Age of Onset  . Prostate cancer Brother     Social History:  reports that he quit smoking about 21 years ago. He does not have any smokeless tobacco history on file. He reports that he does not drink alcohol or use illicit drugs.  ROS: UROLOGY Frequent Urination?: No Hard to postpone urination?: No Burning/pain with urination?: No Get up at night to urinate?: No Leakage of urine?: No Urine stream starts and stops?: No Trouble starting stream?: No Do you have to strain to urinate?: No Blood in urine?: No Urinary tract infection?: No Sexually transmitted disease?: No Injury to kidneys or bladder?: No Painful intercourse?: No Weak stream?: No Erection problems?: No Penile pain?: No  Gastrointestinal Nausea?:  No Vomiting?: No Indigestion/heartburn?: No Diarrhea?: No Constipation?: No  Constitutional Fever: No Night sweats?: No Weight loss?: No Fatigue?: No  Skin Skin rash/lesions?: No Itching?: No  Eyes Blurred vision?: No Double vision?: No  Ears/Nose/Throat Sore throat?: No Sinus problems?: No  Hematologic/Lymphatic Swollen glands?: No Easy bruising?: No  Cardiovascular Leg swelling?: No Chest pain?: No  Respiratory Cough?: No Shortness of breath?: No  Endocrine Excessive thirst?: No  Musculoskeletal Back pain?: No Joint pain?: No  Neurological Headaches?: No Dizziness?: No  Psychologic Depression?: No Anxiety?: No  Physical Exam: BP 165/80 mmHg  Pulse 82  Ht 6' (1.829 m)  Wt 229 lb 1.6 oz (103.919 kg)  BMI 31.06 kg/m2  Constitutional: Well nourished. Alert and oriented, No acute distress. HEENT: [place] AT, moist mucus membranes. Trachea midline, no masses. Cardiovascular: No clubbing, cyanosis, or edema. Respiratory: Normal respiratory effort, no increased work of breathing. Skin: No rashes, bruises or suspicious lesions. Lymph: No cervical or inguinal adenopathy. Neurologic: Grossly intact, no focal deficits, moving all 4 extremities. Psychiatric: Normal mood and affect.  Laboratory Data: Lab Results  Component Value Date   WBC 5.3 05/06/2015   HGB 16.0 06/15/2015   HCT 47.0 06/15/2015   MCV 85.9 05/06/2015   PLT 240 05/06/2015    Lab Results  Component Value Date   CREATININE 1.53* 05/06/2015    Assessment & Plan:    1. S/P cystolitholapaxy with HoLEP procedure:   Patient underwent procedure on 06/15/2015.  He will continue the tamsulosin 0.4 mg daily and discontinue the oxybutynin.  Foley is removed and patient instructed to increase fluids to challenge the bladder for a voiding trial.  If patient is able to void, he return in 6 weeks. At that appointment he will undergo a uroflow/bladder ultrasound and office visit with Dr.  Apolinar JunesBrandon.   Return in about 6 weeks (around 07/29/2015) for follow up Dr. Apolinar JunesBrandon.  These notes generated with voice recognition software. I apologize for typographical errors.  Michiel CowboySHANNON Nana Vastine, PA-C  [facility] [address], [address] [place], [place] [address] [phone]

## 2015-06-19 DIAGNOSIS — Z9079 Acquired absence of other genital organ(s): Secondary | ICD-10-CM | POA: Insufficient documentation

## 2015-07-27 ENCOUNTER — Ambulatory Visit: Payer: Medicare Other | Admitting: Urology

## 2015-07-30 ENCOUNTER — Ambulatory Visit: Payer: Medicare Other | Admitting: Urology

## 2015-08-03 ENCOUNTER — Ambulatory Visit: Payer: Medicare Other | Admitting: Urology

## 2015-08-24 ENCOUNTER — Emergency Department
Admission: EM | Admit: 2015-08-24 | Discharge: 2015-08-24 | Disposition: A | Discharge status: Home / Self Care | Payer: Medicare Other | Attending: Emergency Medicine | Admitting: Emergency Medicine

## 2015-08-24 ENCOUNTER — Encounter: Payer: Self-pay | Admitting: *Deleted

## 2015-08-24 DIAGNOSIS — E785 Hyperlipidemia, unspecified: Secondary | ICD-10-CM | POA: Insufficient documentation

## 2015-08-24 DIAGNOSIS — J45909 Unspecified asthma, uncomplicated: Secondary | ICD-10-CM | POA: Diagnosis not present

## 2015-08-24 DIAGNOSIS — Z87891 Personal history of nicotine dependence: Secondary | ICD-10-CM | POA: Diagnosis not present

## 2015-08-24 DIAGNOSIS — R609 Edema, unspecified: Secondary | ICD-10-CM

## 2015-08-24 DIAGNOSIS — I129 Hypertensive chronic kidney disease with stage 1 through stage 4 chronic kidney disease, or unspecified chronic kidney disease: Secondary | ICD-10-CM | POA: Diagnosis not present

## 2015-08-24 DIAGNOSIS — F329 Major depressive disorder, single episode, unspecified: Secondary | ICD-10-CM | POA: Diagnosis not present

## 2015-08-24 DIAGNOSIS — Z79899 Other long term (current) drug therapy: Secondary | ICD-10-CM | POA: Insufficient documentation

## 2015-08-24 DIAGNOSIS — R2243 Localized swelling, mass and lump, lower limb, bilateral: Secondary | ICD-10-CM | POA: Diagnosis present

## 2015-08-24 DIAGNOSIS — N189 Chronic kidney disease, unspecified: Secondary | ICD-10-CM | POA: Insufficient documentation

## 2015-08-24 DIAGNOSIS — R600 Localized edema: Secondary | ICD-10-CM | POA: Insufficient documentation

## 2015-08-24 LAB — BASIC METABOLIC PANEL
Anion gap: 4 — ABNORMAL LOW (ref 5–15)
BUN: 15 mg/dL (ref 6–20)
CALCIUM: 9.2 mg/dL (ref 8.9–10.3)
CHLORIDE: 111 mmol/L (ref 101–111)
CO2: 26 mmol/L (ref 22–32)
CREATININE: 1.24 mg/dL (ref 0.61–1.24)
GFR calc non Af Amer: 53 mL/min — ABNORMAL LOW (ref >60)
GLUCOSE: 71 mg/dL (ref 65–99)
Potassium: 3.9 mmol/L (ref 3.5–5.1)
Sodium: 141 mmol/L (ref 135–145)

## 2015-08-24 LAB — CBC
HCT: 41.8 % (ref 40.0–52.0)
Hemoglobin: 13.7 g/dL (ref 13.0–18.0)
MCH: 27.9 pg (ref 26.0–34.0)
MCHC: 32.8 g/dL (ref 32.0–36.0)
MCV: 85 fL (ref 80.0–100.0)
PLATELETS: 193 10*3/uL (ref 150–440)
RBC: 4.92 MIL/uL (ref 4.40–5.90)
RDW: 14.6 % — AB (ref 11.5–14.5)
WBC: 3.2 10*3/uL — ABNORMAL LOW (ref 3.8–10.6)

## 2015-08-24 LAB — TROPONIN I

## 2015-08-24 MED ORDER — FUROSEMIDE 10 MG/ML IJ SOLN: 20.0000 mg | Freq: Once | INTRAMUSCULAR | Status: DC

## 2015-08-24 MED ORDER — FUROSEMIDE 40 MG PO TABS
20.0000 mg | ORAL_TABLET | Freq: Once | ORAL | Status: AC
  Administered 2015-08-24: 20 mg via ORAL

## 2015-08-24 MED ORDER — FUROSEMIDE 40 MG PO TABS
ORAL_TABLET | ORAL | Status: AC
  Administered 2015-08-24: 20 mg via ORAL
  Filled 2015-08-24: qty 1

## 2015-08-24 MED ORDER — FUROSEMIDE 20 MG PO TABS: 20.0000 mg | ORAL_TABLET | Freq: Two times a day (BID) | ORAL | Status: AC

## 2015-08-24 NOTE — ED Notes (Signed)
Pt complains of bilateral leg swelling, pt denies dyspnea or any other symptoms

## 2015-08-24 NOTE — Discharge Instructions (Signed)
Peripheral Edema °You have swelling in your legs (peripheral edema). This swelling is due to excess accumulation of salt and water in your body. Edema may be a sign of heart, kidney or liver disease, or a side effect of a medication. It may also be due to problems in the leg veins. Elevating your legs and using special support stockings may be very helpful, if the cause of the swelling is due to poor venous circulation. Avoid long periods of standing, whatever the cause. °Treatment of edema depends on identifying the cause. Chips, pretzels, pickles and other salty foods should be avoided. Restricting salt in your diet is almost always needed. Water pills (diuretics) are often used to remove the excess salt and water from your body via urine. These medicines prevent the kidney from reabsorbing sodium. This increases urine flow. °Diuretic treatment may also result in lowering of potassium levels in your body. Potassium supplements may be needed if you have to use diuretics daily. Daily weights can help you keep track of your progress in clearing your edema. You should call your caregiver for follow up care as recommended. °SEEK IMMEDIATE MEDICAL CARE IF:  °· You have increased swelling, pain, redness, or heat in your legs. °· You develop shortness of breath, especially when lying down. °· You develop chest or abdominal pain, weakness, or fainting. °· You have a fever. °  °This information is not intended to replace advice given to you by your health care provider. Make sure you discuss any questions you have with your health care provider. °  °Document Released: 04/27/2004 Document Revised: 06/12/2011 Document Reviewed: 09/30/2014 °Elsevier Interactive Patient Education ©2016 [employer] ° °Please return immediately if condition worsens. Please contact her primary physician or the physician you were given for referral. If you have any specialist physicians involved in her treatment and plan please also contact them.  Thank you for using [facility]. ° °

## 2015-08-24 NOTE — ED Provider Notes (Signed)
Time Seen: Approximately 1340 I have reviewed the triage notes  Chief Complaint: Leg Swelling   History of Present Illness: Duane Ochoa is a 82 y.o. male who has presentation of one week history of bilateral lower extremity swelling. He has a history of previous edema though based on the historian which is a family member it has not been this bad before in the past. He has history of BPH and urinary retention and dementia. Patient is blood pressures been doing well at home. Has a history of chronic kidney disease back in September 2016. The patient has had no fever or significant leg pain. He's had some difficulty with ambulation secondary to the swelling. This is described as some generalized weakness. Patient denies any shortness of breath or chest pain or chest tightness.   Past Medical History  Diagnosis Date  . Hypertension   . GERD (gastroesophageal reflux disease)   . Asthma   . Depressed   . Glaucoma   . Hyperlipemia   . Elevated PSA   . BPH (benign prostatic hyperplasia)   . Dementia   . Acute urinary retention 10/13/2014  . Benign prostatic hypertrophy with incomplete bladder emptying 02/15/2015  . Chronic kidney disease (CKD) 12/08/2014  . Recurrent UTI 12/08/2014    Patient Active Problem List   Diagnosis Date Noted  . Status post transurethral resection of prostate 06/19/2015  . Bladder calculi 04/01/2015  . Benign prostatic hypertrophy with incomplete bladder emptying 02/15/2015  . Nocturia 02/15/2015  . Chronic kidney disease (CKD) 12/08/2014  . Recurrent UTI 12/08/2014  . UTI (urinary tract infection) 10/13/2014  . Acute urinary retention 10/13/2014    Past Surgical History  Procedure Laterality Date  . None    . No past surgeries    . Holep-laser enucleation of the prostate with morcellation N/A 06/15/2015    Procedure: HOLEP-LASER ENUCLEATION OF THE PROSTATE WITH MORCELLATION;  Surgeon: Vanna ScotlandAshley Brandon, MD;  Location: [facility] OR[facility];  Service: Urology;   Laterality: N/A;  . Cystoscopy with litholapaxy N/A 06/15/2015    Procedure: CYSTOSCOPY WITH LITHOLAPAXY;  Surgeon: Vanna ScotlandAshley Brandon, MD;  Location: [facility];  Service: Urology;  Laterality: N/A;    Past Surgical History  Procedure Laterality Date  . None    . No past surgeries    . Holep-laser enucleation of the prostate with morcellation N/A 06/15/2015    Procedure: HOLEP-LASER ENUCLEATION OF THE PROSTATE WITH MORCELLATION;  Surgeon: Vanna ScotlandAshley Brandon, MD;  Location: [facility];  Service: Urology;  Laterality: N/A;  . Cystoscopy with litholapaxy N/A 06/15/2015    Procedure: CYSTOSCOPY WITH LITHOLAPAXY;  Surgeon: Vanna ScotlandAshley Brandon, MD;  Location: [facility];  Service: Urology;  Laterality: N/A;    Current Outpatient Rx  Name  Route  Sig  Dispense  Refill  . amLODipine (NORVASC) 10 MG tablet   Oral   Take 10 mg by mouth every morning.          . docusate sodium (COLACE) 100 MG capsule   Oral   Take 1 capsule (100 mg total) by mouth 2 (two) times daily.   60 capsule   0   . hydrochlorothiazide (HYDRODIURIL) 25 MG tablet   Oral   Take 25 mg by mouth every morning.          Marland Kitchen. HYDROcodone-acetaminophen (NORCO/VICODIN) 5-325 MG tablet   Oral   Take 1-2 tablets by mouth every 6 (six) hours as needed for moderate pain.   10 tablet   0   . losartan (COZAAR)  25 MG tablet      25 mg every morning.          Marland Kitchen oxybutynin (DITROPAN) 5 MG tablet   Oral   Take 1 tablet (5 mg total) by mouth every 8 (eight) hours as needed for bladder spasms.   30 tablet   0   . tamsulosin (FLOMAX) 0.4 MG CAPS capsule   Oral   Take 1 capsule (0.4 mg total) by mouth daily. Patient taking differently: Take 0.4 mg by mouth daily with lunch.    30 capsule   3     Allergies:  Review of patient's allergies indicates no known allergies.  Family History: Family History  Problem Relation Age of Onset  . Prostate cancer Brother     Social History: Social History  Substance Use Topics  . Smoking  status: Former Smoker -- 15 years    Quit date: 04/03/1994  . Smokeless tobacco: None  . Alcohol Use: No     Comment: quit in 1996     Review of Systems:   10 point review of systems was performed and was otherwise negative:  Constitutional: No fever Eyes: No visual disturbances ENT: No sore throat, ear pain Cardiac: No chest pain Respiratory: No shortness of breath, wheezing, or stridor Abdomen: No abdominal pain, no vomiting, No diarrhea Endocrine: No weight loss, No night sweats Extremities: Peripheral edema stated above Skin: No rashes, easy bruising Neurologic: No focal weakness, trouble with speech or swollowing Urologic: No dysuria, Hematuria, or urinary frequency   Physical Exam:  ED Triage Vitals  Enc Vitals Group     BP 08/24/15 1219 157/88 mmHg     Pulse Rate 08/24/15 1219 61     Resp 08/24/15 1219 20     Temp 08/24/15 1219 98.1 F (36.7 C)     Temp Source 08/24/15 1219 Oral     SpO2 08/24/15 1219 98 %     Weight 08/24/15 1219 236 lb (107.049 kg)     Height 08/24/15 1219 6' (1.829 m)     Head Cir --      Peak Flow --      Pain Score --      Pain Loc --      Pain Edu? --      Excl. in GC? --     General: Awake , Alert , and Oriented times 3; GCS 15 Head: Normal cephalic , atraumatic Eyes: Pupils equal , round, reactive to light Nose/Throat: No nasal drainage, patent upper airway without erythema or exudate.  Neck: Supple, Full range of motion, No anterior adenopathy or palpable thyroid masses Lungs: Clear to ascultation without wheezes , rhonchi, or rales Heart: Regular rate, regular rhythm without murmurs , gallops , or rubs Abdomen: Soft, non tender without rebound, guarding , or rigidity; bowel sounds positive and symmetric in all 4 quadrants. No organomegaly .        Extremities: Circumferential pitting edema up to the level of the knee in both lower extremities. There is no Homan sign or posterior calf tenderness.  Neurologic: normal ambulation,  Motor symmetric without deficits, sensory intact Skin: warm, dry, no rashes   Labs:   All laboratory work was reviewed including any pertinent negatives or positives listed below:  Labs Reviewed  BASIC METABOLIC PANEL - Abnormal; Notable for the following:    GFR calc non Af Amer 53 (*)    Anion gap 4 (*)    All other components within normal limits  CBC -  Abnormal; Notable for the following:    WBC 3.2 (*)    RDW 14.6 (*)    All other components within normal limits  TROPONIN I  URINALYSIS COMPLETEWITH MICROSCOPIC ([facility] ONLY)  Urinalysis was removed and review of the other laboratory work appeared to be within normal limits.  EKG:  ED ECG REPORT I, Jennye Moccasin, the attending physician, personally viewed and interpreted this ECG.  Date: 08/24/2015 EKG Time: 1227 Rate: *63 Rhythm: normal sinus rhythm QRS Axis: normal Intervals: normal ST/T Wave abnormalities: normal Conduction Disturbances: none Narrative Interpretation: unremarkable Left ventricular hypertrophy No acute ischemic changes   ED Course: * Patient's stay here was uneventful and review of his records show that he has adequate renal function and is currently on hydrochlorothiazide. I discussed with the family during a little bit more aggressive diuresis. He does not exhibit any symptoms consistent with pulmonary edema with normal saturations, normal respiratory rate, and no chest pain at this time, i.e. no indications of pulmonary edema. Patient has some mild generalized weakness but nothing focal. Patient will be started on Lasix for the next 5 days for diuresis and was advised follow-up with his primary physician I felt was unlikely patient suffered from bilateral DVTs.   Assessment:  Bilateral Peripheral edema     Plan: Outpatient Patient was advised to return immediately if condition worsens. Patient was advised to follow up with their primary care physician or other specialized physicians involved  in their outpatient care. The patient and/or family member/power of attorney had laboratory results reviewed at the bedside. All questions and concerns were addressed and appropriate discharge instructions were distributed by the nursing staff.              Jennye Moccasin, MD 08/24/15 [phone]

## 2015-08-24 NOTE — ED Notes (Signed)
Bilateral lower leg swelling and foot swelling X 1 week. Denies pain. Pitting edema present bilaterally. Color WNL.

## 2015-09-01 ENCOUNTER — Ambulatory Visit: Payer: Medicare Other | Admitting: Urology

## 2015-09-01 ENCOUNTER — Encounter: Payer: Self-pay | Admitting: Urology

## 2016-01-06 ENCOUNTER — Encounter: Payer: Medicare Other | Admitting: Urology

## 2016-01-06 NOTE — Progress Notes (Signed)
This encounter was created in error - please disregard.

## 2016-01-07 ENCOUNTER — Encounter: Payer: Self-pay | Admitting: Urology

## 2016-04-11 ENCOUNTER — Encounter: Payer: Self-pay | Admitting: Emergency Medicine

## 2016-04-11 ENCOUNTER — Emergency Department
Admission: EM | Admit: 2016-04-11 | Discharge: 2016-04-16 | Disposition: A | Discharge status: Other Acute Inpatient Hospital | Payer: Medicare Other | Attending: Emergency Medicine | Admitting: Emergency Medicine

## 2016-04-11 DIAGNOSIS — N289 Disorder of kidney and ureter, unspecified: Secondary | ICD-10-CM | POA: Diagnosis not present

## 2016-04-11 DIAGNOSIS — Z87891 Personal history of nicotine dependence: Secondary | ICD-10-CM | POA: Diagnosis not present

## 2016-04-11 DIAGNOSIS — F329 Major depressive disorder, single episode, unspecified: Secondary | ICD-10-CM

## 2016-04-11 DIAGNOSIS — F0391 Unspecified dementia with behavioral disturbance: Secondary | ICD-10-CM | POA: Diagnosis not present

## 2016-04-11 DIAGNOSIS — F32A Depression, unspecified: Secondary | ICD-10-CM

## 2016-04-11 DIAGNOSIS — R45851 Suicidal ideations: Secondary | ICD-10-CM

## 2016-04-11 DIAGNOSIS — I10 Essential (primary) hypertension: Secondary | ICD-10-CM | POA: Insufficient documentation

## 2016-04-11 DIAGNOSIS — R45850 Homicidal ideations: Secondary | ICD-10-CM | POA: Insufficient documentation

## 2016-04-11 DIAGNOSIS — Z79899 Other long term (current) drug therapy: Secondary | ICD-10-CM | POA: Diagnosis not present

## 2016-04-11 DIAGNOSIS — J45909 Unspecified asthma, uncomplicated: Secondary | ICD-10-CM | POA: Diagnosis not present

## 2016-04-11 DIAGNOSIS — F03918 Unspecified dementia, unspecified severity, with other behavioral disturbance: Secondary | ICD-10-CM

## 2016-04-11 LAB — COMPREHENSIVE METABOLIC PANEL
ALK PHOS: 70 U/L (ref 38–126)
ALT: 19 U/L (ref 17–63)
ANION GAP: 9 (ref 5–15)
AST: 30 U/L (ref 15–41)
Albumin: 4.5 g/dL (ref 3.5–5.0)
BUN: 21 mg/dL — ABNORMAL HIGH (ref 6–20)
CALCIUM: 10.1 mg/dL (ref 8.9–10.3)
CHLORIDE: 108 mmol/L (ref 101–111)
CO2: 27 mmol/L (ref 22–32)
CREATININE: 1.3 mg/dL — AB (ref 0.61–1.24)
GFR calc Af Amer: 58 mL/min — ABNORMAL LOW (ref >60)
GFR, EST NON AFRICAN AMERICAN: 50 mL/min — AB (ref >60)
Glucose, Bld: 81 mg/dL (ref 65–99)
Potassium: 3.7 mmol/L (ref 3.5–5.1)
Sodium: 144 mmol/L (ref 135–145)
Total Bilirubin: 1.1 mg/dL (ref 0.3–1.2)
Total Protein: 8.1 g/dL (ref 6.5–8.1)

## 2016-04-11 LAB — GLUCOSE, CAPILLARY
GLUCOSE-CAPILLARY: 68 mg/dL (ref 65–99)
Glucose-Capillary: 73 mg/dL (ref 65–99)

## 2016-04-11 LAB — CBC
HCT: 47.9 % (ref 40.0–52.0)
Hemoglobin: 15.9 g/dL (ref 13.0–18.0)
MCH: 29.1 pg (ref 26.0–34.0)
MCHC: 33.3 g/dL (ref 32.0–36.0)
MCV: 87.6 fL (ref 80.0–100.0)
PLATELETS: 206 10*3/uL (ref 150–440)
RBC: 5.47 MIL/uL (ref 4.40–5.90)
RDW: 14.7 % — ABNORMAL HIGH (ref 11.5–14.5)
WBC: 4.5 10*3/uL (ref 3.8–10.6)

## 2016-04-11 LAB — ACETAMINOPHEN LEVEL

## 2016-04-11 LAB — ETHANOL

## 2016-04-11 LAB — SALICYLATE LEVEL: Salicylate Lvl: <7 mg/dL (ref 2.8–30.0)

## 2016-04-11 NOTE — ED Notes (Signed)

## 2016-04-11 NOTE — ED Notes (Signed)
Social worker in to see pt

## 2016-04-11 NOTE — ED Provider Notes (Addendum)
[facility] Emergency Department Provider Note  ____________________________________________  Time seen: Approximately 5:16 PM  I have reviewed the triage vital signs and the nursing notes.   HISTORY  Chief Complaint Altered Mental Status; Homicidal; and Suicidal    HPI Duane Ochoa is a 83 y.o. male with a history of depression brought from [facility] for suicidal and homicidal ideations. The patient has been threatening to kill himself and kill his wife. He has no medical complaints at this time.   Past Medical History:  Diagnosis Date  . Acute urinary retention 10/13/2014  . Asthma   . Benign prostatic hypertrophy with incomplete bladder emptying 02/15/2015  . BPH (benign prostatic hyperplasia)   . Chronic kidney disease (CKD) 12/08/2014  . Dementia   . Depressed   . Elevated PSA   . GERD (gastroesophageal reflux disease)   . Glaucoma   . Hyperlipemia   . Hypertension   . Recurrent UTI 12/08/2014    Patient Active Problem List   Diagnosis Date Noted  . Status post transurethral resection of prostate 06/19/2015  . Bladder calculi 04/01/2015  . Benign prostatic hypertrophy with incomplete bladder emptying 02/15/2015  . Nocturia 02/15/2015  . Chronic kidney disease (CKD) 12/08/2014  . Recurrent UTI 12/08/2014  . UTI (urinary tract infection) 10/13/2014  . Acute urinary retention 10/13/2014    Past Surgical History:  Procedure Laterality Date  . CYSTOSCOPY WITH LITHOLAPAXY N/A 06/15/2015   Procedure: CYSTOSCOPY WITH LITHOLAPAXY;  Surgeon: Vanna Scotland, MD;  Location: [facility] ORS;  Service: Urology;  Laterality: N/A;  . HOLEP-LASER ENUCLEATION OF THE PROSTATE WITH MORCELLATION N/A 06/15/2015   Procedure: HOLEP-LASER ENUCLEATION OF THE PROSTATE WITH MORCELLATION;  Surgeon: Vanna Scotland, MD;  Location: [facility] ORS;  Service: Urology;  Laterality: N/A;  . NO PAST SURGERIES    . none      Current Outpatient Rx  . Order #: [id number] Class: Historical Med   . Order #: [id number] Class: Print  . Order #: [id number] Class: Print  . Order #: [id number] Class: Historical Med  . Order #: [id number] Class: Print  . Order #: [id number] Class: Historical Med  . Order #: [id number] Class: Print  . Order #: [id number] Class: Fax    Allergies Patient has no known allergies.  Family History  Problem Relation Age of Onset  . Prostate cancer Brother     Social History Social History  Substance Use Topics  . Smoking status: Former Smoker    Years: 15.00    Quit date: 04/03/1994  . Smokeless tobacco: Never Used  . Alcohol use No     Comment: quit in 1996    Review of Systems Constitutional: No fever/chills. Eyes: No visual changes. ENT: No sore throat. No congestion or rhinorrhea. Cardiovascular: Denies chest pain. Denies palpitations. Respiratory: Denies shortness of breath.  No cough. Gastrointestinal: No abdominal pain.  No nausea, no vomiting.  No diarrhea.  No constipation. Genitourinary: Negative for dysuria. Musculoskeletal: Negative for back pain. Skin: Negative for rash. Neurological: Negative for headaches. No focal numbness, tingling or weakness.  Psychiatric:Positive SI. Positive HIV. Negative hallucinations.  10-point ROS otherwise negative.  ____________________________________________   PHYSICAL EXAM:  VITAL SIGNS: ED Triage Vitals  Enc Vitals Group     BP 04/11/16 1405 138/74     Pulse Rate 04/11/16 1405 77     Resp 04/11/16 1405 18     Temp 04/11/16 1405 97.8 F (36.6 C)     Temp Source 04/11/16 1405 Oral     SpO2 04/11/16 1405 98 %  Weight 04/11/16 1406 237 lb (107.5 kg)     Height 04/11/16 1406 6' (1.829 m)     Head Circumference --      Peak Flow --      Pain Score 04/11/16 1406 0     Pain Loc --      Pain Edu? --      Excl. in GC? --     Constitutional: Alert and answers some questions properly.. Chronically ill appearing but in no acute distress.  Eyes: Conjunctivae are normal.  EOMI. No scleral  icterus. Head: Atraumatic. Nose: No congestion/rhinnorhea. Mouth/Throat: Mucous membranes are moist.  Neck: No stridor.  Supple.   Cardiovascular: Normal rate, regular rhythm. No murmurs, rubs or gallops.  Respiratory: Normal respiratory effort.  No accessory muscle use or retractions. Lungs CTAB.  No wheezes, rales or ronchi. Gastrointestinal: Soft, nontender and nondistended.  No guarding or rebound.  No peritoneal signs. Musculoskeletal: No LE edema.  Neurologic:  A&Ox3.  Speech is clear.  Face and smile are symmetric.  EOMI.  Moves all extremities well. Skin:  Skin is warm, dry and intact. No rash noted. Psychiatric: Mood and affect are normal. Speech and behavior are normal.  Normal judgement.  ____________________________________________   LABS (all labs ordered are listed, but only abnormal results are displayed)  Labs Reviewed  COMPREHENSIVE METABOLIC PANEL - Abnormal; Notable for the following:       Result Value   BUN 21 (*)    Creatinine, Ser 1.30 (*)    GFR calc non Af Amer 50 (*)    GFR calc Af Amer 58 (*)    All other components within normal limits  CBC - Abnormal; Notable for the following:    RDW 14.7 (*)    All other components within normal limits  ACETAMINOPHEN LEVEL - Abnormal; Notable for the following:    Acetaminophen (Tylenol), Serum <10 (*)    All other components within normal limits  ETHANOL  SALICYLATE LEVEL  GLUCOSE, CAPILLARY  GLUCOSE, CAPILLARY  URINE DRUG SCREEN, QUALITATIVE ([facility] ONLY)  CBG MONITORING, ED   ____________________________________________  EKG  ED ECG REPORT I, Rockne Menghini, the attending physician, personally viewed and interpreted this ECG.   Date: 04/11/2016  EKG Time: 1640  Rate: 72  Rhythm: normal sinus rhythm; poor baseline tracing  Axis: normal  Intervals:none  ST&T Change: No STEMI  Reading on this EKG says atrial fibrillation, but it is unclear what the underlying rhythm is because of a poor  baseline tracing. It is possible the patient has a sinus rhythm with arrhythmia. We will repeat EKG for vital evaluation.  ____________________________________________  RADIOLOGY  No results found.  ____________________________________________   PROCEDURES  Procedure(s) performed: None  Procedures  Critical Care performed: No ____________________________________________   INITIAL IMPRESSION / ASSESSMENT AND PLAN / ED COURSE  Pertinent labs & imaging results that were available during my care of the patient were reviewed by me and considered in my medical decision making (see chart for details).  83 y.o. male with a history of depression brought from [facility] for homicidal and suicidal ideations per the patient is hemodynamically stable and does not have any medical complaints at this time. His x-ray studies are reassuring and he is medically cleared for psychiatric disposition.  ----------------------------------------- 5:46 PM on 04/11/2016 -----------------------------------------  Repeat EKG ED ECG REPORT I, Rockne Menghini, the attending physician, personally viewed and interpreted this ECG.   Date: 04/11/2016  EKG Time: 1733  Rate: 68  Rhythm: normal sinus  rhythm  Axis: normal  Intervals:none  ST&T Change: No STEMI  ____________________________________________  FINAL CLINICAL IMPRESSION(S) / ED DIAGNOSES  Final diagnoses:  Suicidal ideations  Homicidal ideations  Renal insufficiency    Clinical Course       NEW MEDICATIONS STARTED DURING THIS VISIT:  New Prescriptions   No medications on file      Rockne MenghiniAnne-Caroline Edmon Magid, MD 04/11/16 1725    Rockne MenghiniAnne-Caroline Tamon Parkerson, MD 04/11/16 1747

## 2016-04-11 NOTE — ED Triage Notes (Signed)
Patient presents to the ED with new agitation, suicidal ideation and homicidal ideation.  Patient reports to [facility] that he wanted to harm himself and harm his wife.  Patient's wife reports patient's mood swings have been for the last week.  In triage patient denies SI and reports that he made comments to his wife, "just to scare her because she was talking over him."

## 2016-04-12 DIAGNOSIS — F03918 Unspecified dementia, unspecified severity, with other behavioral disturbance: Secondary | ICD-10-CM

## 2016-04-12 DIAGNOSIS — F0391 Unspecified dementia with behavioral disturbance: Secondary | ICD-10-CM | POA: Diagnosis not present

## 2016-04-12 DIAGNOSIS — F329 Major depressive disorder, single episode, unspecified: Secondary | ICD-10-CM

## 2016-04-12 DIAGNOSIS — R45851 Suicidal ideations: Secondary | ICD-10-CM

## 2016-04-12 DIAGNOSIS — F32A Depression, unspecified: Secondary | ICD-10-CM

## 2016-04-12 LAB — URINALYSIS, COMPLETE (UACMP) WITH MICROSCOPIC
Bacteria, UA: NONE SEEN
Bilirubin Urine: NEGATIVE
Glucose, UA: NEGATIVE mg/dL
Hgb urine dipstick: NEGATIVE
Ketones, ur: NEGATIVE mg/dL
Nitrite: NEGATIVE
Protein, ur: NEGATIVE mg/dL
SPECIFIC GRAVITY, URINE: 1.024 (ref 1.005–1.030)
pH: 6 (ref 5.0–8.0)

## 2016-04-12 LAB — URINE DRUG SCREEN, QUALITATIVE (ARMC ONLY)
Amphetamines, Ur Screen: NOT DETECTED
BARBITURATES, UR SCREEN: NOT DETECTED
BENZODIAZEPINE, UR SCRN: NOT DETECTED
CANNABINOID 50 NG, UR ~~LOC~~: NOT DETECTED
Cocaine Metabolite,Ur ~~LOC~~: NOT DETECTED
MDMA (Ecstasy)Ur Screen: NOT DETECTED
METHADONE SCREEN, URINE: NOT DETECTED
Opiate, Ur Screen: NOT DETECTED
Phencyclidine (PCP) Ur S: NOT DETECTED
TRICYCLIC, UR SCREEN: NOT DETECTED

## 2016-04-12 MED ORDER — CITALOPRAM HYDROBROMIDE 20 MG PO TABS
10.0000 mg | ORAL_TABLET | Freq: Every day | ORAL | Status: DC
Start: 2016-04-12 — End: 2016-04-16
  Administered 2016-04-12 – 2016-04-16 (×5): 10 mg via ORAL
  Filled 2016-04-12 (×3): qty 1
  Filled 2016-04-12: qty 2
  Filled 2016-04-12: qty 1

## 2016-04-12 MED ORDER — AMLODIPINE BESYLATE 5 MG PO TABS
5.0000 mg | ORAL_TABLET | Freq: Every day | ORAL | Status: DC
  Administered 2016-04-12 – 2016-04-16 (×5): 5 mg via ORAL
  Filled 2016-04-12 (×5): qty 1

## 2016-04-12 MED ORDER — SIMVASTATIN 20 MG PO TABS
20.0000 mg | ORAL_TABLET | Freq: Every day | ORAL | Status: DC
  Administered 2016-04-12 – 2016-04-15 (×4): 20 mg via ORAL
  Filled 2016-04-12 (×5): qty 1

## 2016-04-12 MED ORDER — FUROSEMIDE 40 MG PO TABS
20.0000 mg | ORAL_TABLET | Freq: Every day | ORAL | Status: DC
  Administered 2016-04-12 – 2016-04-16 (×5): 20 mg via ORAL
  Filled 2016-04-12 (×5): qty 1

## 2016-04-12 MED ORDER — LOSARTAN POTASSIUM 50 MG PO TABS
25.0000 mg | ORAL_TABLET | Freq: Every day | ORAL | Status: DC
  Administered 2016-04-12 – 2016-04-16 (×5): 25 mg via ORAL
  Filled 2016-04-12 (×4): qty 1

## 2016-04-12 MED ORDER — FINASTERIDE 5 MG PO TABS
5.0000 mg | ORAL_TABLET | Freq: Every day | ORAL | Status: DC
  Administered 2016-04-12 – 2016-04-16 (×5): 5 mg via ORAL
  Filled 2016-04-12 (×5): qty 1

## 2016-04-12 MED ORDER — TAMSULOSIN HCL 0.4 MG PO CAPS
0.4000 mg | ORAL_CAPSULE | Freq: Every day | ORAL | Status: DC
  Administered 2016-04-12 – 2016-04-15 (×4): 0.4 mg via ORAL
  Filled 2016-04-12 (×5): qty 1

## 2016-04-12 MED ORDER — HYDROCHLOROTHIAZIDE 25 MG PO TABS
25.0000 mg | ORAL_TABLET | Freq: Every day | ORAL | Status: DC
  Administered 2016-04-12 – 2016-04-16 (×5): 25 mg via ORAL
  Filled 2016-04-12 (×4): qty 1

## 2016-04-12 NOTE — ED Notes (Signed)
CSW engaged with pt at pt's bedside. CSW introduced herself and her role as a Child psychotherapistsocial worker. Pt states that he lives with his wife and would like to return home if his wife would have him. If pt's wife is unwilling to take him back, pt would like to go live with his son. CSW called pt's wife [name] [phone]). The number has apparently been disconnected and is no longer in service. CSW will meet with pt again to see if he is able to provide contact information for his family.   Duane JordanLynn B Duane Ochoa, MSW, Theresia MajorsLCSWA [phone]

## 2016-04-12 NOTE — ED Provider Notes (Signed)
  Physical Exam  BP 135/67 (BP Location: Right Arm)   Pulse 65   Temp 97.4 F (36.3 C) (Oral)   Resp 20   Ht 6' (1.829 m)   Wt 237 lb (107.5 kg)   SpO2 97%   BMI 32.14 kg/m   Physical Exam  ED Course  Procedures  MDM Patient came in for agitation. Psych saw patient, dispo pending placement       Charlynne Panderavid Hsienta Yao, MD 04/12/16 [id number]

## 2016-04-12 NOTE — ED Notes (Signed)
BEHAVIORAL HEALTH ROUNDING Patient sleeping: No. Patient alert and oriented: yes Behavior appropriate: Yes.  ; If no, describe:  Nutrition and fluids offered: yes Toileting and hygiene offered: Yes  Sitter present: q15 minute observations and security  monitoring Law enforcement present: Yes  ODS  

## 2016-04-12 NOTE — ED Notes (Signed)
Attempted to administer medications - pt reports  "Not right now"  I will reattempt again soon

## 2016-04-12 NOTE — ED Notes (Signed)
No am meds ordered at this time  Assessment completed  NAD assessed  Pt denies pain  He has been using the urinal

## 2016-04-12 NOTE — ED Notes (Signed)
BEHAVIORAL HEALTH ROUNDING Patient sleeping: No. Patient alert and oriented:  Oriented to self, place and day  Behavior appropriate: Yes.  ; If no, describe:  Nutrition and fluids offered: yes Toileting and hygiene offered: Yes  Sitter present: q15 minute observations and security monitoring Law enforcement present: Yes  ODS

## 2016-04-12 NOTE — Consult Note (Signed)
[facility] Consult   Reason for Consult:  Consult for 83 year old man brought to the emergency room because of suicidal statements and aggression at home Referring Physician:  Darl Householder Patient Identification: BERTON BUTRICK MRN:  [id number] Principal Diagnosis: Dementia with aggressive behavior Diagnosis:   Patient Active Problem List   Diagnosis Date Noted  . Dementia with aggressive behavior [F03.91] 04/12/2016  . Depression [F32.9] 04/12/2016  . Suicidal ideation [R45.851] 04/12/2016  . Status post transurethral resection of prostate [Z90.79] 06/19/2015  . Bladder calculi [N21.0] 04/01/2015  . Benign prostatic hypertrophy with incomplete bladder emptying [N40.1, R39.14] 02/15/2015  . Nocturia [R35.1] 02/15/2015  . Chronic kidney disease (CKD) [N18.9] 12/08/2014  . Recurrent UTI [N39.0] 12/08/2014  . UTI (urinary tract infection) [N39.0] 10/13/2014  . Acute urinary retention [R33.8] 10/13/2014    Total Time spent with patient: 1 hour  Subjective:   ARMEND HOCHSTATTER is a 83 y.o. male patient admitted with "I haven't been feeling good".  HPI:  Patient interviewed. Chart reviewed. Spoke with his wife by telephone. Reviewed notes from his recent visit to do emergency room. This 83 year old man was apparently brought by law enforcement who were called by the family because he was getting aggressive and making suicidal statements. The patient admits that he made statements about killing himself. He also admits that he had made statements about killing his wife. He denies that he actually meant to follow through on either one of those. Denied that he had done anything to try and hurt himself or anyone else. He tells me that he did it to try to "scare" his wife because he wanted to get attention from her. Wife says that this is very unlike him. She had been in the hospital for a week and a half and came home just after the new year and since then his behavior has been unusual. She  reports that he seems to be more moody irritable and confused. She says that he had actually balled his fist up and she felt frightened of him yesterday. Patient has some understanding that his memory has been getting worse. Denies any psychotic symptoms. Denies any substance abuse. He was just seen at [facility] emergency room 2 days ago and they noted that he was confused but there was no complaint at that time of any specific psychiatric problem.  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 Multiple children. Apparently her son was involved in taking him to [facility] a couple days ago but at home it is just the patient and his wife.  Medical history: She has a history of recurrent urinary tract infections and of chronic kidney disease and a history of hypertension. He was seen at [facility] a couple days ago for complaints of weakness and swelling in his legs. He also complains about wanting to have his prostate "checked out". The patient clearly is having trouble urinating. He admits to symptoms of frequency.  Substance abuse history: He says he hasn't had any alcohol in 20 years. Denies any drug use  Past Psychiatric History: No known past psychiatric history. No history of psychiatric hospitalization no evidence of psychiatric medication. Denies any history of suicide attempts or violence.  Risk to Self:   Risk to Others:   Prior Inpatient Therapy:   Prior Outpatient Therapy:    Past Medical History:  Past Medical History:  Diagnosis Date  . Acute urinary retention 10/13/2014  . Asthma   . Benign prostatic hypertrophy with incomplete  bladder emptying 02/15/2015  . BPH (benign prostatic hyperplasia)   . Chronic kidney disease (CKD) 12/08/2014  . Dementia   . Depressed   . Elevated PSA   . GERD (gastroesophageal reflux disease)   . Glaucoma   . Hyperlipemia   . Hypertension   . Recurrent UTI 12/08/2014    Past Surgical History:  Procedure Laterality Date    . CYSTOSCOPY WITH LITHOLAPAXY N/A 06/15/2015   Procedure: CYSTOSCOPY WITH LITHOLAPAXY;  Surgeon: Hollice Espy, MD;  Location: [facility] ORS;  Service: Urology;  Laterality: N/A;  . HOLEP-LASER ENUCLEATION OF THE PROSTATE WITH MORCELLATION N/A 06/15/2015   Procedure: HOLEP-LASER ENUCLEATION OF THE PROSTATE WITH MORCELLATION;  Surgeon: Hollice Espy, MD;  Location: [facility] ORS;  Service: Urology;  Laterality: N/A;  . NO PAST SURGERIES    . none     Family History:  Family History  Problem Relation Age of Onset  . Prostate cancer Brother    Family Psychiatric  History: Denies being aware of any family history of mental health problems Social History:  History  Alcohol Use No    Comment: quit in 1996     History  Drug Use No    Social History   Social History  . Marital status: Married    Spouse name: N/A  . Number of children: N/A  . Years of education: N/A   Social History Main Topics  . Smoking status: Former Smoker    Years: 15.00    Quit date: 04/03/1994  . Smokeless tobacco: Never Used  . Alcohol use No     Comment: quit in 1996  . Drug use: No  . Sexual activity: Not Asked   Other Topics Concern  . None   Social History Narrative  . None   Additional Social History:    Allergies:  No Known Allergies  Labs:  Results for orders placed or performed during the hospital encounter of 04/11/16 (from the past 48 hour(s))  Glucose, capillary     Status: None   Collection Time: 04/11/16  2:04 PM  Result Value Ref Range   Glucose-Capillary 68 65 - 99 mg/dL   Comment 1 Notify RN   Comprehensive metabolic panel     Status: Abnormal   Collection Time: 04/11/16  2:06 PM  Result Value Ref Range   Sodium 144 135 - 145 mmol/L   Potassium 3.7 3.5 - 5.1 mmol/L   Chloride 108 101 - 111 mmol/L   CO2 27 22 - 32 mmol/L   Glucose, Bld 81 65 - 99 mg/dL   BUN 21 (H) 6 - 20 mg/dL   Creatinine, Ser 1.30 (H) 0.61 - 1.24 mg/dL   Calcium 10.1 8.9 - 10.3 mg/dL   Total Protein 8.1 6.5 -  8.1 g/dL   Albumin 4.5 3.5 - 5.0 g/dL   AST 30 15 - 41 U/L   ALT 19 17 - 63 U/L   Alkaline Phosphatase 70 38 - 126 U/L   Total Bilirubin 1.1 0.3 - 1.2 mg/dL   GFR calc non Af Amer 50 (L) >60 mL/min   GFR calc Af Amer 58 (L) >60 mL/min    Comment: (NOTE) The eGFR has been calculated using the CKD EPI equation. This calculation has not been validated in all clinical situations. eGFR's persistently <60 mL/min signify possible Chronic Kidney Disease.    Anion gap 9 5 - 15  CBC     Status: Abnormal   Collection Time: 04/11/16  2:06 PM  Result Value Ref  Range   WBC 4.5 3.8 - 10.6 K/uL   RBC 5.47 4.40 - 5.90 MIL/uL   Hemoglobin 15.9 13.0 - 18.0 g/dL   HCT 47.9 40.0 - 52.0 %   MCV 87.6 80.0 - 100.0 fL   MCH 29.1 26.0 - 34.0 pg   MCHC 33.3 32.0 - 36.0 g/dL   RDW 14.7 (H) 11.5 - 14.5 %   Platelets 206 150 - 440 K/uL  Ethanol     Status: None   Collection Time: 04/11/16  2:06 PM  Result Value Ref Range   Alcohol, Ethyl (B) <5 <5 mg/dL    Comment:        LOWEST DETECTABLE LIMIT FOR SERUM ALCOHOL IS 5 mg/dL FOR MEDICAL PURPOSES ONLY   Salicylate level     Status: None   Collection Time: 04/11/16  2:06 PM  Result Value Ref Range   Salicylate Lvl <7.9 2.8 - 30.0 mg/dL  Acetaminophen level     Status: Abnormal   Collection Time: 04/11/16  2:06 PM  Result Value Ref Range   Acetaminophen (Tylenol), Serum <10 (L) 10 - 30 ug/mL    Comment:        THERAPEUTIC CONCENTRATIONS VARY SIGNIFICANTLY. A RANGE OF 10-30 ug/mL MAY BE AN EFFECTIVE CONCENTRATION FOR MANY PATIENTS. HOWEVER, SOME ARE BEST TREATED AT CONCENTRATIONS OUTSIDE THIS RANGE. ACETAMINOPHEN CONCENTRATIONS >150 ug/mL AT 4 HOURS AFTER INGESTION AND >50 ug/mL AT 12 HOURS AFTER INGESTION ARE OFTEN ASSOCIATED WITH TOXIC REACTIONS.   Glucose, capillary     Status: None   Collection Time: 04/11/16  2:38 PM  Result Value Ref Range   Glucose-Capillary 73 65 - 99 mg/dL    Current Facility-Administered Medications    Medication Dose Route Frequency Provider Last Rate Last Dose  . amLODipine (NORVASC) tablet 5 mg  5 mg Oral Daily Gonzella Lex, MD      . citalopram (CELEXA) tablet 10 mg  10 mg Oral Daily Gonzella Lex, MD      . finasteride (PROSCAR) tablet 5 mg  5 mg Oral Daily Zymier Rodgers T Shimeka Bacot, MD      . furosemide (LASIX) tablet 20 mg  20 mg Oral Daily Gonzella Lex, MD      . hydrochlorothiazide (HYDRODIURIL) tablet 25 mg  25 mg Oral Daily Gonzella Lex, MD      . losartan (COZAAR) tablet 25 mg  25 mg Oral Daily Gonzella Lex, MD      . simvastatin (ZOCOR) tablet 20 mg  20 mg Oral q1800 Gonzella Lex, MD      . tamsulosin (FLOMAX) capsule 0.4 mg  0.4 mg Oral QPC supper Gonzella Lex, MD       Current Outpatient Prescriptions  Medication Sig Dispense Refill  . amLODipine (NORVASC) 10 MG tablet Take 10 mg by mouth daily.     . finasteride (PROSCAR) 5 MG tablet Take 5 mg by mouth daily.    . hydrochlorothiazide (HYDRODIURIL) 25 MG tablet Take 25 mg by mouth daily.     Marland Kitchen losartan (COZAAR) 25 MG tablet Take 25 mg by mouth daily.     . simvastatin (ZOCOR) 20 MG tablet Take 20 mg by mouth at bedtime.    . tamsulosin (FLOMAX) 0.4 MG CAPS capsule Take 1 capsule (0.4 mg total) by mouth daily. (Patient taking differently: Take 0.4 mg by mouth daily with lunch. ) 30 capsule 3  . docusate sodium (COLACE) 100 MG capsule Take 1 capsule (100 mg total) by mouth  2 (two) times daily. 60 capsule 0  . furosemide (LASIX) 20 MG tablet Take 1 tablet (20 mg total) by mouth 2 (two) times daily. (Patient not taking: Reported on 04/12/2016) 10 tablet 0  . HYDROcodone-acetaminophen (NORCO/VICODIN) 5-325 MG tablet Take 1-2 tablets by mouth every 6 (six) hours as needed for moderate pain. (Patient not taking: Reported on 04/12/2016) 10 tablet 0  . oxybutynin (DITROPAN) 5 MG tablet Take 1 tablet (5 mg total) by mouth every 8 (eight) hours as needed for bladder spasms. (Patient not taking: Reported on 04/12/2016) 30 tablet 0     Musculoskeletal: Strength & Muscle Tone: decreased Gait & Station: ataxic Patient leans: N/A  Psychiatric Specialty Exam: Physical Exam  Nursing note and vitals reviewed. Constitutional: He appears well-developed and well-nourished.  HENT:  Head: Normocephalic and atraumatic.    Eyes: Conjunctivae are normal. Pupils are equal, round, and reactive to light.  Neck: Normal range of motion.  Cardiovascular: Regular rhythm and normal heart sounds.   Respiratory: Effort normal.  GI: Soft.  Musculoskeletal: Normal range of motion.  Neurological: He is alert.  Skin: Skin is warm and dry.  Psychiatric: His affect is blunt. His speech is delayed. He is slowed. He expresses impulsivity. He expresses suicidal ideation. He expresses no suicidal plans. He exhibits abnormal recent memory.    Review of Systems  HENT: Negative.   Eyes: Negative.   Respiratory: Negative.   Cardiovascular: Negative.   Gastrointestinal: Negative.   Genitourinary: Positive for frequency.  Musculoskeletal: Negative.   Skin: Negative.   Neurological: Positive for focal weakness and weakness.  Psychiatric/Behavioral: Positive for depression, memory loss and suicidal ideas. Negative for hallucinations and substance abuse. The patient is not nervous/anxious.     Blood pressure 135/67, pulse 65, temperature 97.4 F (36.3 C), temperature source Oral, resp. rate 20, height 6' (1.829 m), weight 107.5 kg (237 lb), SpO2 97 %.Body mass index is 32.14 kg/m.  General Appearance: Disheveled  Eye Contact:  Fair  Speech:  Garbled and Slow  Volume:  Decreased  Mood:  Anxious and Dysphoric  Affect:  Constricted  Thought Process:  Goal Directed  Orientation:  Full (Time, Place, and Person)  Thought Content:  Tangential  Suicidal Thoughts:  Yes.  without intent/plan  Homicidal Thoughts:  No  Memory:  Immediate;   Fair Recent;   Poor Remote;   Fair  Judgement:  Impaired  Insight:  Shallow  Psychomotor Activity:   Decreased  Concentration:  Concentration: Fair  Recall:  [employer] of Knowledge:  Fair  Language:  Fair  Akathisia:  No  Handed:  Right  AIMS (if indicated):     Assets:  Desire for Improvement Financial Resources/Insurance Housing Social Support  ADL's:  Impaired  Cognition:  Impaired,  Mild  Sleep:        Treatment Plan Summary: Daily contact with patient to assess and evaluate symptoms and progress in treatment, Medication management and Plan 83 year old man who appears to of had a recent change in his mental state. He is showing signs of some dementia which has been documented previously. Wife is reporting that his behavior and mood have been different than usual. She reports being frightened of them and having concerns about his suicidal and homicidal statements. Medical workup so far does not show a specific treatable problem although we don't have a urine analysis back yet. Does not appear to be safe to go back home right now. I will start low-dose citalopram for mood. Urinalysis is ordered.  I will try to contact [facility] to request referral to geriatric psychiatry although so far I have been unable to contact anyone.  Disposition: Recommend psychiatric Inpatient admission when medically cleared. Supportive therapy provided about ongoing stressors.  Alethia Berthold, MD 04/12/2016 12:49 PM

## 2016-04-12 NOTE — ED Notes (Signed)
ED  Is the patient under IVC or is there intent for IVC: no  Is the patient medically cleared: Yes.   Is there vacancy in the ED BHU: Yes.   Is the population mix appropriate for patient: geriatric Is the patient awaiting placement in inpatient or outpatient setting: Yes. inpt geripsych admission   Has the patient had a psychiatric consult: Yes.   Survey of unit performed for contraband, proper placement and condition of furniture, tampering with fixtures in bathroom, shower, and each patient room: Yes.  ; Findings:  APPEARANCE/BEHAVIOR Calm and cooperative NEURO ASSESSMENT Orientation: oriented to self, place and day   Denies pain Hallucinations: No.None noted (Hallucinations) Speech: Normal Gait: normal - unsteady  - assisted recommended  RESPIRATORY ASSESSMENT Even  Unlabored respirations  CARDIOVASCULAR ASSESSMENT Pulses equal   regular rate  Skin warm and dry   GASTROINTESTINAL ASSESSMENT no GI complaint EXTREMITIES Full ROM  PLAN OF CARE Provide calm/safe environment. Vital signs assessed twice daily. ED BHU Assessment once each 12-hour shift. Collaborate with TTS daily or as condition indicates. Assure the ED provider has rounded once each shift. Provide and encourage hygiene. Provide redirection as needed. Assess for escalating behavior; address immediately and inform ED provider.  Assess family dynamic and appropriateness for visitation as needed: Yes.  ; If necessary, describe findings:  Educate the patient/family about BHU procedures/visitation: Yes.  ; If necessary, describe findings:

## 2016-04-12 NOTE — ED Notes (Signed)
PT IVC/ PENDING PLACEMENT  

## 2016-04-12 NOTE — ED Notes (Signed)
BEHAVIORAL HEALTH ROUNDING Patient sleeping: No. Patient alert and oriented: yes oriented to self place and day  Behavior appropriate: Yes.  ; If no, describe:  Nutrition and fluids offered: yes Toileting and hygiene offered: Yes  Sitter present: q15 minute observations and security monitoring Law enforcement present: Yes  ODS

## 2016-04-12 NOTE — ED Notes (Signed)
Pt observed with no unusual behavior - lying in bed TV is on   Appropriate to stimulation  No verbalized needs or concerns at this time  NAD assessed  Continue to monitor

## 2016-04-12 NOTE — ED Notes (Signed)
Pt given breakfast tray. Pt stated he is unable to walk, pt was given urinal.

## 2016-04-12 NOTE — ED Notes (Signed)

## 2016-04-13 MED ORDER — ENSURE ENLIVE PO LIQD
1.0000 | Freq: Two times a day (BID) | ORAL | Status: DC
  Administered 2016-04-13 – 2016-04-15 (×4): 237 mL via ORAL

## 2016-04-13 NOTE — ED Notes (Signed)

## 2016-04-13 NOTE — ED Notes (Addendum)
Pt only ate small amount of food today, did drink some juice and diet coke. Pt states he is not hungry and denies any nausea. D/w Dr. Roxan Hockeyobinson.

## 2016-04-13 NOTE — ED Notes (Signed)
Visitor at bedside.

## 2016-04-13 NOTE — BHH Counselor (Signed)
Referral information for Geriatric Placement have been faxed to:  [facility] )[phone])  [facility] [phone])  [facility] [phone])  [facility] [phone])  [facility] [phone])

## 2016-04-13 NOTE — ED Notes (Signed)
CSW followed up with the following referrals;    Duane Ochoa [phone]), No answer. CSW left a message on Duane Ochoa's voicemail.   Duane Ochoa [phone]), Per Duane Ochoa, pt is currently on the waiting list.   Duane Ochoa [phone] or [phone]), Per Duane Ochoa, fax was received. Pt is under review.   Duane Ochoa [phone]), Declined due to dementia being exclusionary criteria.   Duane Ochoa, MSW, Theresia MajorsLCSWA [phone]

## 2016-04-13 NOTE — ED Notes (Signed)
IVC/Pending placement 

## 2016-04-13 NOTE — ED Provider Notes (Signed)
-----------------------------------------   7:35 AM on 04/13/2016 -----------------------------------------   Blood pressure 135/72, pulse 61, temperature 98.1 F (36.7 C), temperature source Oral, resp. rate 16, height 6' (1.829 m), weight 237 lb (107.5 kg), SpO2 99 %.  The patient had no acute events since last update.  Calm and cooperative at this time.  Disposition is pending Psychiatry/Behavioral Medicine team recommendations.     Jennye MoccasinBrian S Quigley, MD 04/13/16 [phone]

## 2016-04-13 NOTE — ED Notes (Signed)
CSW called pt's wife at [phone]. Ms. Samule OhmDowney states that she is afraid of the pt and is unsure if he can return upon d/c. CSW expressed her understanding and asked if there would be alternative arrangements made for pt. Ms. Samule OhmDowney states that if pt does not return to his home upon d/c, he may be able to stay with one of his sons. Pt is still awaiting a bed for inpt psych. CSW will continue to be in contact with pt's family to provide them with updates.  Jonathon JordanLynn B Hau Sanor, MSW, Theresia MajorsLCSWA [phone]

## 2016-04-14 MED ORDER — HYDROCHLOROTHIAZIDE 25 MG PO TABS
ORAL_TABLET | ORAL | Status: AC
  Administered 2016-04-14: 25 mg via ORAL
  Filled 2016-04-14: qty 1

## 2016-04-14 NOTE — ED Notes (Signed)
Patient was dried 

## 2016-04-14 NOTE — ED Notes (Signed)

## 2016-04-14 NOTE — ED Notes (Signed)
Patient on phone  talking to his sister.

## 2016-04-14 NOTE — ED Notes (Signed)
Patient refused lunch notified nurse Noreene LarssonJill

## 2016-04-14 NOTE — ED Notes (Signed)
Spoke to son of pt, Duane LenzChester Ochoa, [phone], will call back during phone hours and states will visit this weekend.  Individual reports that pt can come live with him, "That's my pops, I ain't gonna let him be alone"

## 2016-04-14 NOTE — Progress Notes (Signed)
LCSW and student from [employer] has been calling and following up with [facility] facilities.  [facility]ed [facility] and spoke to Admission patient remains under review [phone] fwd to ABHU They want to call his son to ensure the patient is able to return with his son.  Delta Air LinesClaudine Janylah Belgrave LCSW [phone]

## 2016-04-14 NOTE — ED Notes (Signed)

## 2016-04-14 NOTE — ED Notes (Signed)
PT IVC/ PENDING PLACEMENT  

## 2016-04-14 NOTE — ED Notes (Signed)
Patient visiting with his daughter, and eating ice cream

## 2016-04-14 NOTE — ED Notes (Signed)
Patient was dried and turned on side

## 2016-04-14 NOTE — ED Provider Notes (Signed)
-----------------------------------------   6:28 AM on 04/14/2016 -----------------------------------------   Blood pressure 130/78, pulse 61, temperature 97.9 F (36.6 C), temperature source Oral, resp. rate 16, height 6' (1.829 m), weight 237 lb (107.5 kg), SpO2 99 %.  The patient had no acute events since last update.  Calm and cooperative at this time.  Disposition is pending Psychiatry/Behavioral Medicine team recommendations.     Irean HongJade J Sung, MD 04/14/16 [phone]

## 2016-04-14 NOTE — ED Notes (Signed)
Patient refused breakfast 

## 2016-04-14 NOTE — ED Notes (Signed)
Patient had bedbath 

## 2016-04-15 NOTE — ED Notes (Signed)
Cleaned patient up and changed bed linens. Provided patient with 2 warm blankets

## 2016-04-15 NOTE — ED Notes (Signed)
BEHAVIORAL HEALTH ROUNDING Patient sleeping: No. Patient alert and oriented: yes Behavior appropriate: Yes.  ; If no, describe:  Nutrition and fluids offered: Yes  Toileting and hygiene offered: Yes  Sitter present: not applicable Law enforcement present: Yes  

## 2016-04-15 NOTE — ED Notes (Signed)
Patient didn't want anything for snack at this time

## 2016-04-15 NOTE — ED Notes (Signed)
BEHAVIORAL HEALTH ROUNDING  Patient sleeping: No.  Patient alert and oriented: yes  Behavior appropriate: Yes. ; If no, describe:  Nutrition and fluids offered: Yes  Toileting and hygiene offered: Yes  Sitter present: not applicable, Q 15 min safety rounds and observation.  Law enforcement present: Yes ODS  

## 2016-04-15 NOTE — ED Provider Notes (Signed)
-----------------------------------------   7:37 AM on 04/15/2016 -----------------------------------------   Blood pressure 108/62, pulse 68, temperature 98.2 F (36.8 C), temperature source Oral, resp. rate 17, height 6' (1.829 m), weight 237 lb (107.5 kg), SpO2 99 %.  The patient had no acute events since last update.  Calm and cooperative at this time.  Disposition is pending Psychiatry/Behavioral Medicine team recommendations.     Jeanmarie PlantJames A Mersedes Alber, MD 04/15/16 [phone]

## 2016-04-15 NOTE — BH Assessment (Signed)
Follow up on faxed referrals on 04-13-2016  [facility] - per Selena BattenKim the referral was not received and the referrals was faxed out again.  [facility] - per Selena BattenKim the patient remains on the wait list,.   [facility] - per Coleen the unit is at capacity  [facility] - per Trinna PostAlex the unit is at capacity.

## 2016-04-15 NOTE — Progress Notes (Signed)
LCSW called TTS and provided patients wife number as number on face sheet disconnected. TTS to call this wife to explain a bed [facility] is [facility].  Delta Air LinesClaudine Alissandra Geoffroy LCSW [phone]

## 2016-04-15 NOTE — ED Notes (Signed)

## 2016-04-15 NOTE — ED Notes (Signed)
BEHAVIORAL HEALTH ROUNDING Patient sleeping: Yes.   Patient alert and oriented: not applicable Behavior appropriate: Yes.  ; If no, describe:  Nutrition and fluids offered: Yes  and No Toileting and hygiene offered: No Sitter present: not applicable Law enforcement present: Yes  

## 2016-04-15 NOTE — ED Notes (Signed)
Patient resting comfortably and not needing anything at this time

## 2016-04-15 NOTE — ED Notes (Signed)
Pt. Ate all of breakfast. 

## 2016-04-15 NOTE — ED Notes (Signed)
ENVIRONMENTAL ASSESSMENT  Potentially harmful objects out of patient reach: Yes.  Personal belongings secured: Yes.  Patient dressed in hospital provided attire only: Yes.  Plastic bags out of patient reach: Yes.  Patient care equipment (cords, cables, call bells, lines, and drains) shortened, removed, or accounted for: Yes.  Equipment and supplies removed from bottom of stretcher: Yes.  Potentially toxic materials out of patient reach: Yes.  Sharps container removed or out of patient reach: Yes.   BEHAVIORAL HEALTH ROUNDING  Patient sleeping: No.  Patient alert and oriented: yes  Behavior appropriate: Yes. ; If no, describe:  Nutrition and fluids offered: Yes  Toileting and hygiene offered: Yes  Sitter present: not applicable, Q 15 min safety rounds and observation.  Law enforcement present: Yes ODS  ED BHU PLACEMENT JUSTIFICATION  Is the patient under IVC or is there intent for IVC: Yes.  Is the patient medically cleared: Yes.  Is there vacancy in the ED BHU: Yes.  Is the population mix appropriate for patient: No  Is the patient awaiting placement in inpatient or outpatient setting: Yes.  Has the patient had a psychiatric consult: Yes.  Survey of unit performed for contraband, proper placement and condition of furniture, tampering with fixtures in bathroom, shower, and each patient room: Yes. ; Findings: All clear  APPEARANCE/BEHAVIOR  calm, cooperative and adequate rapport can be established  NEURO ASSESSMENT  Orientation: time, place and person at his baseline Hallucinations: No.None noted (Hallucinations)  Speech: Normal  Gait: unsteady RESPIRATORY ASSESSMENT  WNL  CARDIOVASCULAR ASSESSMENT  WNL  GASTROINTESTINAL ASSESSMENT  WNL  EXTREMITIES  WNL  PLAN OF CARE  Provide calm/safe environment. Vital signs assessed TID. ED BHU Assessment once each 12-hour shift. Collaborate with TTS daily or as condition indicates. Assure the ED provider has rounded once each shift.  Provide and encourage hygiene. Provide redirection as needed. Assess for escalating behavior; address immediately and inform ED provider.  Assess family dynamic and appropriateness for visitation as needed: Yes. ; If necessary, describe findings:  Educate the patient/family about BHU procedures/visitation: Yes. ; If necessary, describe findings: Pt is calm and cooperative at this time. Pt understanding and accepting of unit procedures/rules. Will continue to monitor with Q 15 min safety rounds and observation.

## 2016-04-15 NOTE — ED Notes (Signed)
Pt refused to eat his dinner tray, but pt did consume a Ensure.

## 2016-04-15 NOTE — ED Notes (Signed)
Pt urinated in brief, pt cleaned and new brief applied.

## 2016-04-15 NOTE — ED Notes (Signed)
BEHAVIORAL HEALTH ROUNDING Patient sleeping: Yes.   Patient alert and oriented: not applicable SLEEPING Behavior appropriate: Yes.  ; If no, describe: SLEEPING Nutrition and fluids offered: No SLEEPING Toileting and hygiene offered: NoSLEEPING Sitter present: not applicable, Q 15 min safety rounds and observation. Law enforcement present: Yes ODS 

## 2016-04-16 ENCOUNTER — Emergency Department: Payer: Medicare Other

## 2016-04-16 LAB — COMPREHENSIVE METABOLIC PANEL
ALBUMIN: 4 g/dL (ref 3.5–5.0)
ALT: 22 U/L (ref 17–63)
AST: 20 U/L (ref 15–41)
Alkaline Phosphatase: 58 U/L (ref 38–126)
Anion gap: 8 (ref 5–15)
BUN: 27 mg/dL — AB (ref 6–20)
CHLORIDE: 107 mmol/L (ref 101–111)
CO2: 29 mmol/L (ref 22–32)
CREATININE: 1.29 mg/dL — AB (ref 0.61–1.24)
Calcium: 9.7 mg/dL (ref 8.9–10.3)
GFR calc Af Amer: 58 mL/min — ABNORMAL LOW (ref >60)
GFR calc non Af Amer: 50 mL/min — ABNORMAL LOW (ref >60)
Glucose, Bld: 100 mg/dL — ABNORMAL HIGH (ref 65–99)
POTASSIUM: 3.4 mmol/L — AB (ref 3.5–5.1)
Sodium: 144 mmol/L (ref 135–145)
Total Bilirubin: 1.1 mg/dL (ref 0.3–1.2)
Total Protein: 7.6 g/dL (ref 6.5–8.1)

## 2016-04-16 LAB — CBC
HEMATOCRIT: 47.5 % (ref 40.0–52.0)
Hemoglobin: 16 g/dL (ref 13.0–18.0)
MCH: 29.1 pg (ref 26.0–34.0)
MCHC: 33.6 g/dL (ref 32.0–36.0)
MCV: 86.5 fL (ref 80.0–100.0)
PLATELETS: 190 10*3/uL (ref 150–440)
RBC: 5.49 MIL/uL (ref 4.40–5.90)
RDW: 14.6 % — AB (ref 11.5–14.5)
WBC: 6.2 10*3/uL (ref 3.8–10.6)

## 2016-04-16 NOTE — ED Provider Notes (Signed)
I spoke with the psychiatry social worker who completed arrangements for [facility] in [facility].  [facility]   Governor Rooksebecca Neno Hohensee, MD 04/16/16 [phone]

## 2016-04-16 NOTE — ED Notes (Signed)
BEHAVIORAL HEALTH ROUNDING Patient sleeping: Yes.   Patient alert and oriented: not applicable SLEEPING Behavior appropriate: Yes.  ; If no, describe: SLEEPING Nutrition and fluids offered: No SLEEPING Toileting and hygiene offered: NoSLEEPING Sitter present: not applicable, Q 15 min safety rounds and observation. Law enforcement present: Yes ODS 

## 2016-04-16 NOTE — ED Provider Notes (Signed)
-----------------------------------------   7:07 AM on 04/16/2016 -----------------------------------------   Blood pressure 113/80, pulse 70, temperature 98.5 F (36.9 C), temperature source Oral, resp. rate 18, height 6' (1.829 m), weight 237 lb (107.5 kg), SpO2 95 %.  The patient had no acute events since last update.  Calm and cooperative at this time. Referrals have been sent out and patient is awaiting acceptance for inpatient treatment.     Rebecka ApleyAllison P Marlies Ligman, MD 04/16/16 [phone]

## 2016-04-16 NOTE — ED Notes (Signed)
Report given to [place]ada[place]ittany, Charity fundraiserN at [facility]

## 2016-04-16 NOTE — Progress Notes (Signed)
TTS spoke with [facility].  She is requesting a CBC, CMP, UA, and Chest X-Ray for admission.  Jill SideColleen reports current labs are outdated (more than 72 hours old).  She will speak with their physician for admission once the labs are approved.  TTS informed Dewayne Hatchnn, RN [phone] of the requested labs.

## 2016-04-16 NOTE — ED Notes (Signed)
In and out cath attempted; no urine return.

## 2016-04-16 NOTE — Progress Notes (Signed)
LCSW and TTS consulted no further needs from SW at this time. Wifes contact info is [phone]  Arrie SenateClaudine Keanen Dohse LCSW [phone]

## 2016-04-16 NOTE — ED Notes (Signed)
Pt to xray escorted by tech and officer

## 2016-04-16 NOTE — ED Notes (Signed)
Coordinated with [place] [facility] [employer] Transport to transport patient at same time with Mr. Coralee NorthClapp, will be here approximately 1245

## 2016-04-16 NOTE — Progress Notes (Signed)
04/17/15 0845 [facility] accepted [facility] [place].  Accepting MD: Dr Norm ParcelVictor Rosado.  Call report to [phone]. Informed Luann, Licensed conveyancerUnit Secretary.  Unable to reach Dr Shaune PollackLord at this time. Daleen SquibbGreg Coco Sharpnack, LCSW

## 2016-04-16 NOTE — ED Notes (Signed)
Son at bedside to visit with pt. Family given directions and and phone number to [facility] [place]

## 2016-04-16 NOTE — ED Notes (Signed)
Ensure ordered from cafeteria.

## 2016-05-25 ENCOUNTER — Other Ambulatory Visit: Payer: Self-pay | Admitting: Nurse Practitioner

## 2016-05-25 DIAGNOSIS — R413 Other amnesia: Secondary | ICD-10-CM

## 2016-05-29 ENCOUNTER — Ambulatory Visit: Payer: Medicare Other

## 2016-06-15 ENCOUNTER — Encounter: Payer: Self-pay | Admitting: Urology

## 2016-06-15 ENCOUNTER — Ambulatory Visit: Payer: Self-pay | Admitting: Urology

## 2016-07-28 ENCOUNTER — Ambulatory Visit: Payer: Self-pay | Admitting: Urology

## 2016-08-15 ENCOUNTER — Ambulatory Visit (INDEPENDENT_AMBULATORY_CARE_PROVIDER_SITE_OTHER): Payer: Medicare Other | Admitting: Urology

## 2016-08-15 ENCOUNTER — Encounter: Payer: Self-pay | Admitting: Urology

## 2016-08-15 VITALS — BP 127/61 | HR 65 | Ht 72.0 in | Wt 211.5 lb

## 2016-08-15 DIAGNOSIS — N401 Enlarged prostate with lower urinary tract symptoms: Secondary | ICD-10-CM | POA: Diagnosis not present

## 2016-08-15 DIAGNOSIS — N138 Other obstructive and reflux uropathy: Secondary | ICD-10-CM | POA: Diagnosis not present

## 2016-08-15 DIAGNOSIS — R351 Nocturia: Secondary | ICD-10-CM

## 2016-08-15 DIAGNOSIS — A630 Anogenital (venereal) warts: Secondary | ICD-10-CM | POA: Diagnosis not present

## 2016-08-15 LAB — BLADDER SCAN AMB NON-IMAGING: Scan Result: 95

## 2016-08-15 NOTE — Progress Notes (Signed)
08/15/2016 11:03 AM   Duane Ochoa 08-20-1932 [id number]  Referring provider: Titus Mould, NP [address] [place], [place] [address]  Chief Complaint  Patient presents with  . Benign Prostatic Hypertrophy    HPI: Follow-up for BPH and lower urinary tract symptoms. Patient has a history of urinary retention and bladder stone and underwent a cystoscopy litholapaxy and HoLEP March 2017 with Dr. Apolinar Junes. A Jan 2018 UA was clear with Bun 27, cr 1.29 - stable.   Scrotal condyloma noted in 2016 - largest about 10 mm.   Today, he was seen for the above. He complains of some frequency. He voids with a good flow. No straining. PVR 95 mL. NG risk includes dementia.  He's on tamsulosin and oxybutynin prn.    PMH: Past Medical History:  Diagnosis Date  . Acute urinary retention 10/13/2014  . Asthma   . Benign prostatic hypertrophy with incomplete bladder emptying 02/15/2015  . BPH (benign prostatic hyperplasia)   . Chronic kidney disease (CKD) 12/08/2014  . Dementia   . Depressed   . Elevated PSA   . GERD (gastroesophageal reflux disease)   . Glaucoma   . Hyperlipemia   . Hypertension   . Recurrent UTI 12/08/2014    Surgical History: Past Surgical History:  Procedure Laterality Date  . CYSTOSCOPY WITH LITHOLAPAXY N/A 06/15/2015   Procedure: CYSTOSCOPY WITH LITHOLAPAXY;  Surgeon: Vanna Scotland, MD;  Location: [facility] ORS;  Service: Urology;  Laterality: N/A;  . HOLEP-LASER ENUCLEATION OF THE PROSTATE WITH MORCELLATION N/A 06/15/2015   Procedure: HOLEP-LASER ENUCLEATION OF THE PROSTATE WITH MORCELLATION;  Surgeon: Vanna Scotland, MD;  Location: [facility] ORS;  Service: Urology;  Laterality: N/A;  . NO PAST SURGERIES    . none      Home Medications:  Allergies as of 08/15/2016      Reactions   Ace Inhibitors Swelling      Medication List       Accurate as of 08/15/16 11:03 AM. Always use your most recent med list.          amLODipine 10 MG tablet Commonly known  as:  NORVASC Take 10 mg by mouth daily.   docusate sodium 100 MG capsule Commonly known as:  COLACE Take 1 capsule (100 mg total) by mouth 2 (two) times daily.   finasteride 5 MG tablet Commonly known as:  PROSCAR Take 5 mg by mouth daily.   furosemide 20 MG tablet Commonly known as:  LASIX Take 1 tablet (20 mg total) by mouth 2 (two) times daily.   hydrochlorothiazide 25 MG tablet Commonly known as:  HYDRODIURIL Take 25 mg by mouth daily.   HYDROcodone-acetaminophen 5-325 MG tablet Commonly known as:  NORCO/VICODIN Take 1-2 tablets by mouth every 6 (six) hours as needed for moderate pain.   losartan 25 MG tablet Commonly known as:  COZAAR Take 25 mg by mouth daily.   oxybutynin 5 MG tablet Commonly known as:  DITROPAN Take 1 tablet (5 mg total) by mouth every 8 (eight) hours as needed for bladder spasms.   simvastatin 20 MG tablet Commonly known as:  ZOCOR Take 20 mg by mouth at bedtime.   tamsulosin 0.4 MG Caps capsule Commonly known as:  FLOMAX Take 1 capsule (0.4 mg total) by mouth daily.       Allergies:  Allergies  Allergen Reactions  . Ace Inhibitors Swelling    Family History: Family History  Problem Relation Age of Onset  . Prostate cancer Brother  Social History:  reports that he quit smoking about 22 years ago. He quit after 15.00 years of use. He has never used smokeless tobacco. He reports that he does not drink alcohol or use drugs.  ROS:                                        Physical Exam: There were no vitals taken for this visit.  Constitutional:  Alert and oriented, No acute distress. HEENT: [place] AT, moist mucus membranes.  Trachea midline, no masses. Cardiovascular: No clubbing, cyanosis, or edema. Respiratory: Normal respiratory effort, no increased work of breathing. GI: Abdomen is soft, nontender, nondistended, no abdominal masses GU: No CVA tenderness.  Penis - no lesion Scrotum - several condyloma on  left hemiscrotum - largest about 2 cm. No bleeding.  DRE - prostate about 50 grams, smooth - no hard area or nodule.  Skin: No rashes, bruises or suspicious lesions. Lymph: No cervical or inguinal adenopathy. Neurologic: Grossly intact, no focal deficits, moving all 4 extremities. Psychiatric: Normal mood and affect.  Laboratory Data: Lab Results  Component Value Date   WBC 6.2 04/16/2016   HGB 16.0 04/16/2016   HCT 47.5 04/16/2016   MCV 86.5 04/16/2016   PLT 190 04/16/2016    Lab Results  Component Value Date   CREATININE 1.29 (H) 04/16/2016    No results found for: PSA  No results found for: TESTOSTERONE  No results found for: HGBA1C  Urinalysis    Component Value Date/Time   COLORURINE YELLOW (A) 04/12/2016 1222   APPEARANCEUR CLEAR (A) 04/12/2016 1222   APPEARANCEUR Clear 04/01/2015 1003   LABSPEC 1.024 04/12/2016 1222   LABSPEC 1.018 06/07/2012 2124   PHURINE 6.0 04/12/2016 1222   GLUCOSEU NEGATIVE 04/12/2016 1222   GLUCOSEU Negative 06/07/2012 2124   HGBUR NEGATIVE 04/12/2016 1222   BILIRUBINUR NEGATIVE 04/12/2016 1222   BILIRUBINUR Negative 04/01/2015 1003   BILIRUBINUR Negative 06/07/2012 2124   KETONESUR NEGATIVE 04/12/2016 1222   PROTEINUR NEGATIVE 04/12/2016 1222   NITRITE NEGATIVE 04/12/2016 1222   LEUKOCYTESUR TRACE (A) 04/12/2016 1222   LEUKOCYTESUR Negative 04/01/2015 1003   LEUKOCYTESUR 3+ 06/07/2012 2124     Assessment & Plan:    1. Nocturia, BPH -  stable. Nl exam/DRE and PVR.  - Bladder Scan (Post Void Residual) in office  2. Scrotal condyloma - enlarging - pt not bothered and they are not bleeding. Recheck in 1 year.     No Follow-up on file.  Jerilee FieldESKRIDGE, Kinney Sackmann, MD  [facility] [address], [address] [place], [place] [address] [phone]

## 2016-09-08 ENCOUNTER — Telehealth: Payer: Self-pay | Admitting: Radiology

## 2016-09-08 NOTE — Telephone Encounter (Signed)
Haywood LassoLynette, from [facility], called stating pt feels his scrotal condyloma is causing his incontinence & therefore wants to have them removed. She asks is this something that can be done in the office or would it have to be done in the OR? Pt was seen recently & reported that he was not bothered by them. Does pt need to RTC to discuss? Dr Apolinar JunesBrandon feels this needs to be discussed with the pt in the clinic before he is scheduled for surgery. Please advise.

## 2016-09-11 NOTE — Telephone Encounter (Signed)
Message  Received: 3 days ago  Message Contents  Duane Ochoa, Matthew, Duane Ochoa  Duane Ochoa, Duane Clayson Leigh, Duane Ochoa  Caller: Unspecified (3 days ago, 8:52 AM)        Yes. They need to be removed. Have him see Dr. Apolinar JunesBrandon or Dr. Sherryl BartersBudzyn since one of them will be doing the procedure. The condyloma are large, but I think they will find they will be easy to excise and repair the scrotum (but agree, probably something they should look at first).     Notified Lynette from [facility] of Dr Estil DaftEskridge's note above who stated pt's social worker would need to be called regarding making an appointment. Spoke with social worker, Saintclair Halstedhesha Carr, to set up appointment with Dr Sherryl BartersBudzyn.

## 2016-09-14 ENCOUNTER — Ambulatory Visit (INDEPENDENT_AMBULATORY_CARE_PROVIDER_SITE_OTHER): Payer: Medicare Other | Admitting: Urology

## 2016-09-14 ENCOUNTER — Encounter: Payer: Self-pay | Admitting: Urology

## 2016-09-14 VITALS — BP 134/73 | HR 56 | Ht 72.0 in | Wt 212.5 lb

## 2016-09-14 DIAGNOSIS — N400 Enlarged prostate without lower urinary tract symptoms: Secondary | ICD-10-CM | POA: Diagnosis not present

## 2016-09-14 DIAGNOSIS — A630 Anogenital (venereal) warts: Secondary | ICD-10-CM | POA: Diagnosis not present

## 2016-09-14 NOTE — Progress Notes (Signed)
09/14/2016 3:02 PM   Duane Ochoa [birth year] [id number]  Referring provider: Titus MouldWhite, Elizabeth Burney, NP [address] [place], [place] [id number]  Chief Complaint  Patient presents with  . Follow-up    Discuss surgery    HPI: The patient is a 83 year old gentleman with a past medical history of BPH who presents today to discuss removal of his scrotal condylomas which were first in 2016. On exam in May 2018, he had multiple condylomas with the largest being approximately 2 cm. He is concerned that his incontinence is being caused by the condylomas and therefore is requesting for them to be removed. She is demented at baseline. Most of the questions were answered by his assisted living staff who have only known him for 1 month. He does not consent to his own procedures. Social services is his power of attorney.  Patient has a history of urinary retention and bladder stone and underwent a cystoscopy litholapaxy and HoLEP March 2017 with Dr. Apolinar JunesBrandon. A Jan 2018 UA was clear with Bun 27, cr 1.29 - stable. 50 gm benign prostate on DRE in May 2018.  He's on tamsulosin.  He does have a history of dementia.  PMH: Past Medical History:  Diagnosis Date  . Acute urinary retention 10/13/2014  . Asthma   . Benign prostatic hypertrophy with incomplete bladder emptying 02/15/2015  . BPH (benign prostatic hyperplasia)   . Chronic kidney disease (CKD) 12/08/2014  . Dementia   . Depressed   . Elevated PSA   . GERD (gastroesophageal reflux disease)   . Glaucoma   . Hyperlipemia   . Hypertension   . Recurrent UTI 12/08/2014    Surgical History: Past Surgical History:  Procedure Laterality Date  . CYSTOSCOPY WITH LITHOLAPAXY N/A 06/15/2015   Procedure: CYSTOSCOPY WITH LITHOLAPAXY;  Surgeon: Vanna ScotlandAshley Brandon, MD;  Location: [facility];  Service: Urology;  Laterality: N/A;  . HOLEP-LASER ENUCLEATION OF THE PROSTATE WITH MORCELLATION N/A 06/15/2015   Procedure: HOLEP-LASER ENUCLEATION OF THE PROSTATE  WITH MORCELLATION;  Surgeon: Vanna ScotlandAshley Brandon, MD;  Location: [facility];  Service: Urology;  Laterality: N/A;    Home Medications:  Allergies as of 09/14/2016      Reactions   Ace Inhibitors Swelling      Medication List       Accurate as of 09/14/16  3:02 PM. Always use your most recent med list.          amLODipine 10 MG tablet Commonly known as:  NORVASC Take 10 mg by mouth daily.   docusate sodium 100 MG capsule Commonly known as:  COLACE Take 1 capsule (100 mg total) by mouth 2 (two) times daily.   escitalopram 10 MG tablet Commonly known as:  LEXAPRO Take 10 mg by mouth daily.   finasteride 5 MG tablet Commonly known as:  PROSCAR Take 5 mg by mouth daily.   furosemide 20 MG tablet Commonly known as:  LASIX Take 1 tablet (20 mg total) by mouth 2 (two) times daily.   hydrochlorothiazide 25 MG tablet Commonly known as:  HYDRODIURIL Take 25 mg by mouth daily.   HYDROcodone-acetaminophen 5-325 MG tablet Commonly known as:  NORCO/VICODIN Take 1-2 tablets by mouth every 6 (six) hours as needed for moderate pain.   losartan 25 MG tablet Commonly known as:  COZAAR Take 25 mg by mouth daily.   oxybutynin 5 MG tablet Commonly known as:  DITROPAN Take 1 tablet (5 mg total) by mouth every 8 (eight) hours as needed for bladder  spasms.   risperiDONE 0.5 MG tablet Commonly known as:  RISPERDAL Take 0.5 mg by mouth at bedtime.   simvastatin 20 MG tablet Commonly known as:  ZOCOR Take 20 mg by mouth at bedtime.   tamsulosin 0.4 MG Caps capsule Commonly known as:  FLOMAX Take 1 capsule (0.4 mg total) by mouth daily.   traZODone 50 MG tablet Commonly known as:  DESYREL Take 50 mg by mouth at bedtime.       Allergies:  Allergies  Allergen Reactions  . Ace Inhibitors Swelling    Family History: Family History  Problem Relation Age of Onset  . Prostate cancer Brother   . Kidney cancer Neg Hx   . Bladder Cancer Neg Hx     Social History:  reports that he  quit smoking about 22 years ago. He quit after 15.00 years of use. He has never used smokeless tobacco. He reports that he does not drink alcohol or use drugs.  ROS: UROLOGY Frequent Urination?: No Hard to postpone urination?: No Burning/pain with urination?: No Get up at night to urinate?: No Leakage of urine?: No Urine stream starts and stops?: No Trouble starting stream?: No Do you have to strain to urinate?: No Blood in urine?: No Urinary tract infection?: No Sexually transmitted disease?: No Injury to kidneys or bladder?: No Painful intercourse?: No Weak stream?: No Erection problems?: No Penile pain?: No  Gastrointestinal Nausea?: No Vomiting?: No Indigestion/heartburn?: No Diarrhea?: No Constipation?: No  Constitutional Fever: No Night sweats?: No Weight loss?: No Fatigue?: No  Skin Skin rash/lesions?: No Itching?: No  Eyes Blurred vision?: No Double vision?: No  Ears/Nose/Throat Sore throat?: No Sinus problems?: No  Hematologic/Lymphatic Swollen glands?: No Easy bruising?: No  Cardiovascular Leg swelling?: No Chest pain?: No  Respiratory Cough?: No Shortness of breath?: No  Endocrine Excessive thirst?: No  Musculoskeletal Back pain?: No Joint pain?: No  Neurological Headaches?: No Dizziness?: No  Psychologic Depression?: No Anxiety?: No  Physical Exam: BP 134/73   Pulse (!) 56   Ht 6' (1.829 m)   Wt 212 lb 8 oz (96.4 kg)   BMI 28.82 kg/m   Constitutional:  Alert and oriented, No acute distress. HEENT: [place] AT, moist mucus membranes.  Trachea midline, no masses. Cardiovascular: No clubbing, cyanosis, or edema. Respiratory: Normal respiratory effort, no increased work of breathing. GI: Abdomen is soft, nontender, nondistended, no abdominal masses GU: No CVA tenderness. Multiple condylomas in the left hemiscrotum. The largest approximate 2 cm's. This is consistent with his previous exam. Skin: No rashes, bruises or suspicious  lesions. Lymph: No cervical or inguinal adenopathy. Neurologic: Grossly intact, no focal deficits, moving all 4 extremities. Psychiatric: Normal mood and affect.  Laboratory Data: Lab Results  Component Value Date   WBC 6.2 04/16/2016   HGB 16.0 04/16/2016   HCT 47.5 04/16/2016   MCV 86.5 04/16/2016   PLT 190 04/16/2016    Lab Results  Component Value Date   CREATININE 1.29 (H) 04/16/2016    No results found for: PSA  No results found for: TESTOSTERONE  No results found for: HGBA1C  Urinalysis    Component Value Date/Time   COLORURINE YELLOW (A) 04/12/2016 1222   APPEARANCEUR CLEAR (A) 04/12/2016 1222   APPEARANCEUR Clear 04/01/2015 1003   LABSPEC 1.024 04/12/2016 1222   LABSPEC 1.018 06/07/2012 2124   PHURINE 6.0 04/12/2016 1222   GLUCOSEU NEGATIVE 04/12/2016 1222   GLUCOSEU Negative 06/07/2012 2124   HGBUR NEGATIVE 04/12/2016 1222   BILIRUBINUR NEGATIVE 04/12/2016  1222   BILIRUBINUR Negative 04/01/2015 1003   BILIRUBINUR Negative 06/07/2012 2124   KETONESUR NEGATIVE 04/12/2016 1222   PROTEINUR NEGATIVE 04/12/2016 1222   NITRITE NEGATIVE 04/12/2016 1222   LEUKOCYTESUR TRACE (A) 04/12/2016 1222   LEUKOCYTESUR Negative 04/01/2015 1003   LEUKOCYTESUR 3+ 06/07/2012 2124    Assessment & Plan:    1. Condylomas I discussed with the patient and his caregivers that his condyloma 7 present for at least a number of years since he has been seen in this office. I did inform the patient and his caregivers that this would not have anything to do with incontinence. I did discuss removing condylomas of this size in a 83 year old patient with dementia would likely be excessive. I'm also concerned that his wounds would get infected due to his incontinence. At this point, I recommended conservative management at this time. I reassured them multiple times at removing the condylomas would not improve his incontinence.  2. BPH Continue Flomax  Return in about 1 year (around  09/14/2017).  Hildred Laser, MD  [facility] [address] [place], [place] [address] [phone]

## 2016-11-29 IMAGING — CR DG ABDOMEN 1V
2 series · 2 of 2 positions shown · non-contrast
Comparison: None in PACs

CLINICAL DATA: Renal failure, possible kidney stones

EXAM:
ABDOMEN - 1 VIEW

[abdomen kub (1 of 2)]
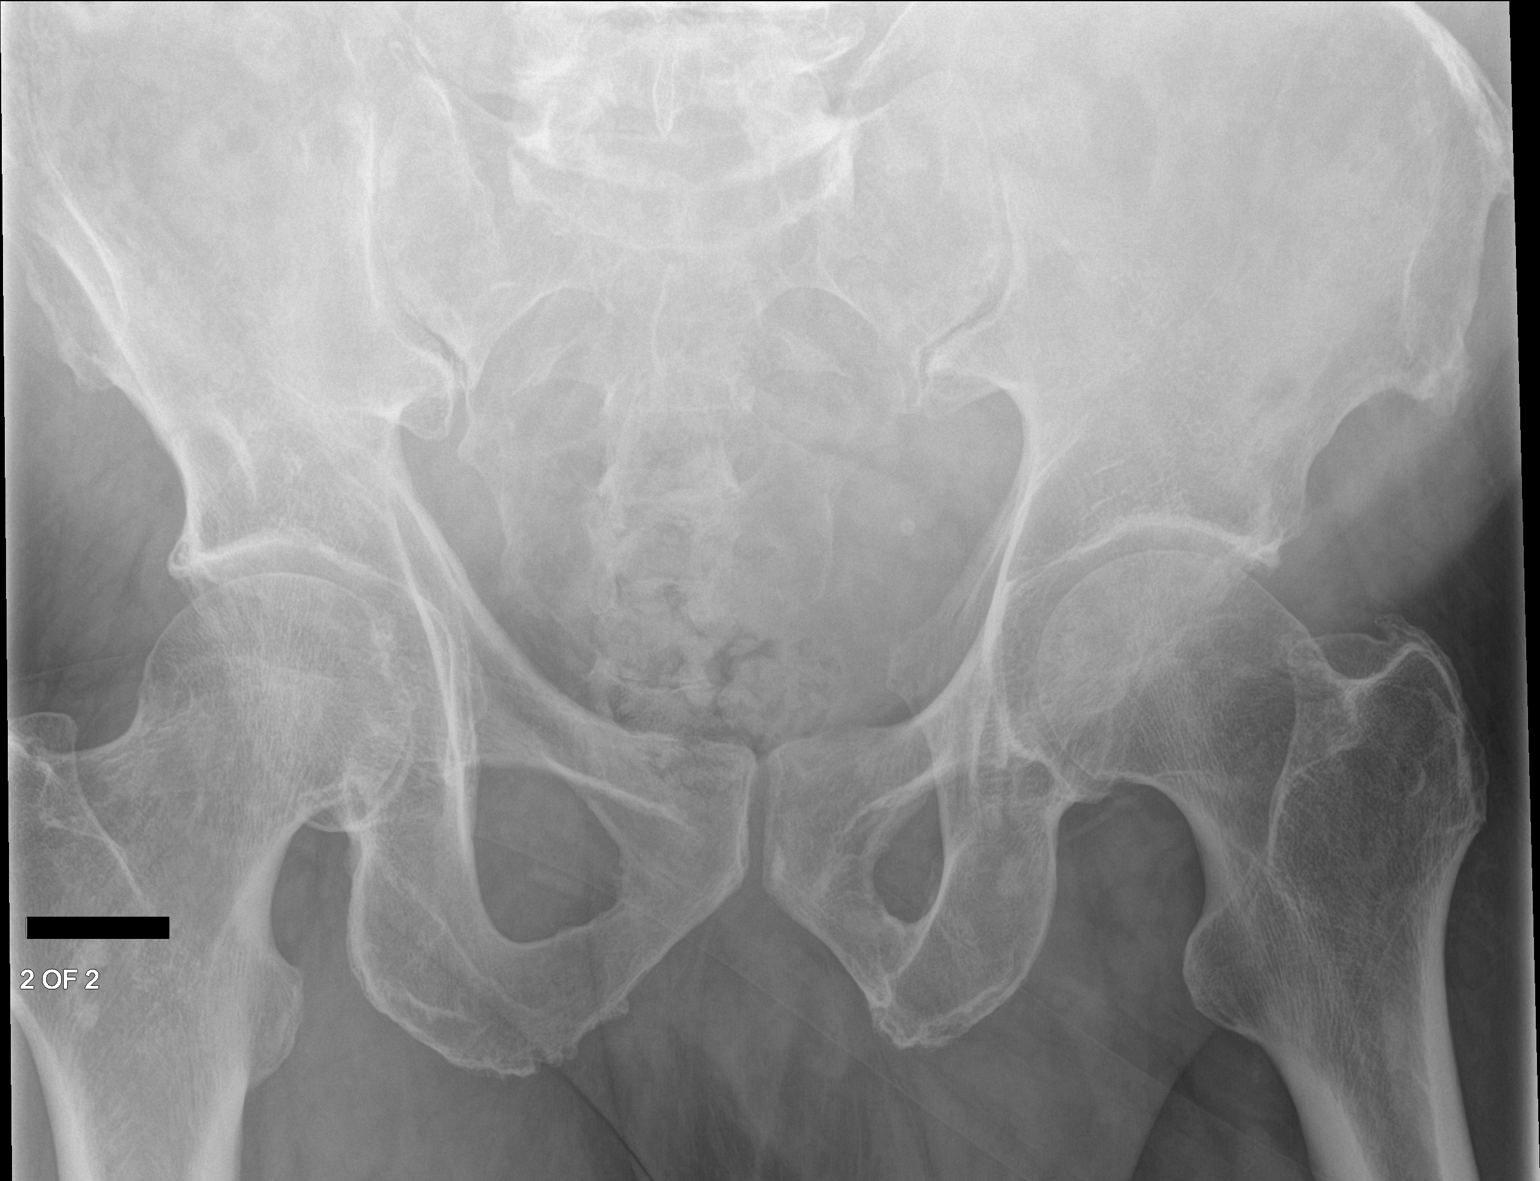

[abdomen kub (2 of 2)]
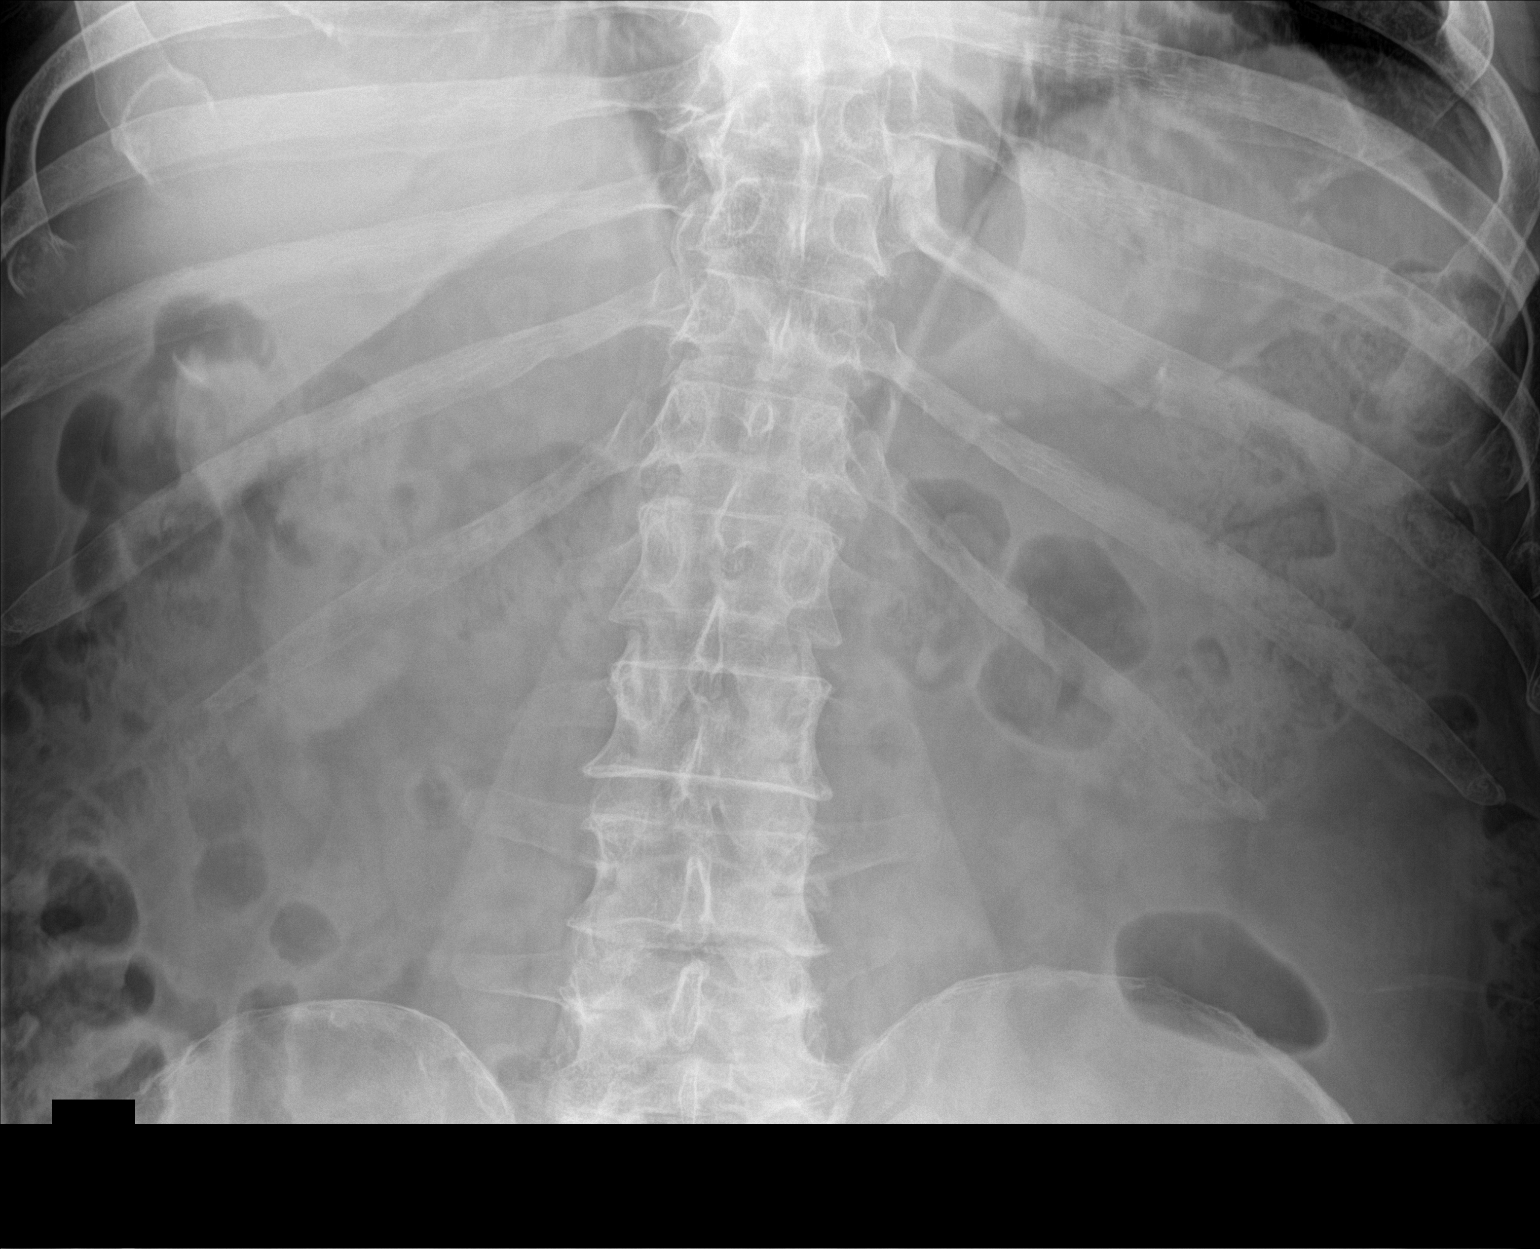

[2 of 2 positions shown; findings below may reference images not displayed]

FINDINGS: No abnormal calcifications project over the renal fossa E
bilaterally. Along the course of the ureters no stones are evident.
There is a phlebolith in the left aspect of the pelvis. The bony
structures exhibit no acute abnormalities. The bowel gas pattern is
unremarkable.
IMPRESSION: There is no radiographic evidence of calcified urinary tract stones.

## 2017-01-01 ENCOUNTER — Other Ambulatory Visit: Payer: Self-pay | Admitting: Nurse Practitioner

## 2017-01-01 DIAGNOSIS — R413 Other amnesia: Secondary | ICD-10-CM

## 2017-01-01 DIAGNOSIS — R252 Cramp and spasm: Secondary | ICD-10-CM

## 2017-01-08 ENCOUNTER — Ambulatory Visit: Payer: Medicare Other

## 2017-01-10 ENCOUNTER — Ambulatory Visit
Admission: RE | Admit: 2017-01-10 | Discharge: 2017-01-10 | Disposition: A | Discharge status: Home / Self Care | Payer: Medicare Other | Source: Ambulatory Visit | Attending: Nurse Practitioner | Admitting: Nurse Practitioner

## 2017-01-10 DIAGNOSIS — R252 Cramp and spasm: Secondary | ICD-10-CM | POA: Diagnosis present

## 2017-01-10 DIAGNOSIS — I7389 Other specified peripheral vascular diseases: Secondary | ICD-10-CM | POA: Insufficient documentation

## 2017-01-10 DIAGNOSIS — R413 Other amnesia: Secondary | ICD-10-CM

## 2017-08-16 ENCOUNTER — Encounter: Payer: Self-pay | Admitting: Urology

## 2017-08-16 ENCOUNTER — Ambulatory Visit (INDEPENDENT_AMBULATORY_CARE_PROVIDER_SITE_OTHER): Payer: Medicare Other | Admitting: Urology

## 2017-08-16 VITALS — BP 149/67 | HR 63 | Ht 72.0 in | Wt 198.9 lb

## 2017-08-16 DIAGNOSIS — A630 Anogenital (venereal) warts: Secondary | ICD-10-CM | POA: Diagnosis not present

## 2017-08-16 DIAGNOSIS — N400 Enlarged prostate without lower urinary tract symptoms: Secondary | ICD-10-CM

## 2017-08-16 LAB — BLADDER SCAN AMB NON-IMAGING: Scan Result: 202

## 2017-08-16 NOTE — Progress Notes (Signed)
08/16/2017 11:10 AM   Duane Ochoa Nov 04, 1932 [id number]  Referring provider: Titus Mould, NP [address] [place], [place] [address]  Chief Complaint  Patient presents with  . Nocturia    HPI: The patient is a 84 year old gentleman with a past medical history of BPH on flomax and finasteride who presents for annual follow up.   1.  BPH Patient is currently on Flomax and finasteride. He as a history of urinary retention and bladder stone and underwent a cystoscopy litholapaxy and HoLEP March 2017 with Dr. Apolinar Junes.  50 gm benign prostate on DRE in May 2018.  Today like last year, all questions were answered by his caregivers during this visit.  He has worn depends now at least 1 year for urinary incontinence.  He has no complaints about having to wear the depends.  He has had no obvious difficulties emptying his bladder, dysuria, UTIs, hematuria, or nephrolithiasis.  The patient and his caregivers have no concerns at this time about his urinary status.  PVR today is 202.  Patient has past medical history of dementia.  2. Condylomas The patient has had scrotal condylomas since at least 2016 with the largest being approximately 2 cm.  In 2018, he was concerned that the condylomas were the source of his incontinence when he presented to the office.  He was reassured that the condylomas do not cause incontinence.  The decision at that time was to manage his condylomas conservatively due to his age and dementia.  All questions were again answered today by his assisted living staff. He does not consent to his own procedures. Social services is his power of attorney    PMH: Past Medical History:  Diagnosis Date  . Acute urinary retention 10/13/2014  . Asthma   . Benign prostatic hypertrophy with incomplete bladder emptying 02/15/2015  . BPH (benign prostatic hyperplasia)   . Chronic kidney disease (CKD) 12/08/2014  . Dementia   . Depressed   . Elevated PSA   . GERD  (gastroesophageal reflux disease)   . Glaucoma   . Hyperlipemia   . Hypertension   . Recurrent UTI 12/08/2014    Surgical History: Past Surgical History:  Procedure Laterality Date  . CYSTOSCOPY WITH LITHOLAPAXY N/A 06/15/2015   Procedure: CYSTOSCOPY WITH LITHOLAPAXY;  Surgeon: Vanna Scotland, MD;  Location: [facility];  Service: Urology;  Laterality: N/A;  . HOLEP-LASER ENUCLEATION OF THE PROSTATE WITH MORCELLATION N/A 06/15/2015   Procedure: HOLEP-LASER ENUCLEATION OF THE PROSTATE WITH MORCELLATION;  Surgeon: Vanna Scotland, MD;  Location: [facility];  Service: Urology;  Laterality: N/A;    Home Medications:  Allergies as of 08/16/2017      Reactions   Ace Inhibitors Swelling      Medication List        Accurate as of 08/16/17 11:10 AM. Always use your most recent med list.          amLODipine 10 MG tablet Commonly known as:  NORVASC Take 10 mg by mouth daily.   docusate sodium 100 MG capsule Commonly known as:  COLACE Take 1 capsule (100 mg total) by mouth 2 (two) times daily.   escitalopram 10 MG tablet Commonly known as:  LEXAPRO Take 10 mg by mouth daily.   finasteride 5 MG tablet Commonly known as:  PROSCAR Take 5 mg by mouth daily.   furosemide 20 MG tablet Commonly known as:  LASIX Take 1 tablet (20 mg total) by mouth 2 (two) times daily.   hydrochlorothiazide  25 MG tablet Commonly known as:  HYDRODIURIL Take 25 mg by mouth daily.   losartan 25 MG tablet Commonly known as:  COZAAR Take 25 mg by mouth daily.   potassium chloride 10 MEQ CR capsule Commonly known as:  MICRO-K Take 10 mEq by mouth 2 (two) times daily.   risperiDONE 0.5 MG tablet Commonly known as:  RISPERDAL Take 0.5 mg by mouth at bedtime.   simvastatin 20 MG tablet Commonly known as:  ZOCOR Take 20 mg by mouth at bedtime.   tamsulosin 0.4 MG Caps capsule Commonly known as:  FLOMAX Take 1 capsule (0.4 mg total) by mouth daily.   traZODone 50 MG tablet Commonly known as:   DESYREL Take 50 mg by mouth at bedtime.       Allergies:  Allergies  Allergen Reactions  . Ace Inhibitors Swelling    Family History: Family History  Problem Relation Age of Onset  . Prostate cancer Brother   . Kidney cancer Neg Hx   . Bladder Cancer Neg Hx     Social History:  reports that he quit smoking about 23 years ago. He quit after 15.00 years of use. He has never used smokeless tobacco. He reports that he does not drink alcohol or use drugs.  ROS: UROLOGY Frequent Urination?: No Hard to postpone urination?: No Burning/pain with urination?: No Get up at night to urinate?: No Leakage of urine?: No Urine stream starts and stops?: No Trouble starting stream?: No Do you have to strain to urinate?: No Blood in urine?: No Urinary tract infection?: No Sexually transmitted disease?: No Injury to kidneys or bladder?: No Painful intercourse?: No Weak stream?: No Erection problems?: No Penile pain?: No  Gastrointestinal Nausea?: No Vomiting?: No Indigestion/heartburn?: No Diarrhea?: No Constipation?: No  Constitutional Fever: No Night sweats?: No Weight loss?: No Fatigue?: No  Skin Skin rash/lesions?: No Itching?: No  Eyes Blurred vision?: No Double vision?: No  Ears/Nose/Throat Sore throat?: No Sinus problems?: No  Hematologic/Lymphatic Swollen glands?: No Easy bruising?: No  Cardiovascular Leg swelling?: No Chest pain?: No  Respiratory Cough?: No Shortness of breath?: No  Endocrine Excessive thirst?: No  Musculoskeletal Back pain?: No Joint pain?: No  Neurological Headaches?: No Dizziness?: No  Psychologic Depression?: No Anxiety?: No  Physical Exam: BP (!) 149/67 (BP Location: Right Arm, Patient Position: Sitting, Cuff Size: Normal)   Pulse 63   Ht 6' (1.829 m)   Wt 198 lb 14.4 oz (90.2 kg)   BMI 26.98 kg/m   Constitutional:  Alert and oriented, No acute distress. HEENT: [place] AT, moist mucus membranes.  Trachea  midline, no masses. Cardiovascular: No clubbing, cyanosis, or edema. Respiratory: Normal respiratory effort, no increased work of breathing. GI: Abdomen is soft, nontender, nondistended, no abdominal masses GU: No CVA tenderness.  Skin: No rashes, bruises or suspicious lesions. Lymph: No cervical or inguinal adenopathy. Neurologic: Grossly intact, no focal deficits, moving all 4 extremities. Psychiatric: Normal mood and affect.  Laboratory Data: Lab Results  Component Value Date   WBC 6.2 04/16/2016   HGB 16.0 04/16/2016   HCT 47.5 04/16/2016   MCV 86.5 04/16/2016   PLT 190 04/16/2016    Lab Results  Component Value Date   CREATININE 1.29 (H) 04/16/2016    No results found for: PSA  No results found for: TESTOSTERONE  No results found for: HGBA1C  Urinalysis    Component Value Date/Time   COLORURINE YELLOW (A) 04/12/2016 1222   APPEARANCEUR CLEAR (A) 04/12/2016 1222  APPEARANCEUR Clear 04/01/2015 1003   LABSPEC 1.024 04/12/2016 1222   LABSPEC 1.018 06/07/2012 2124   PHURINE 6.0 04/12/2016 1222   GLUCOSEU NEGATIVE 04/12/2016 1222   GLUCOSEU Negative 06/07/2012 2124   HGBUR NEGATIVE 04/12/2016 1222   BILIRUBINUR NEGATIVE 04/12/2016 1222   BILIRUBINUR Negative 04/01/2015 1003   BILIRUBINUR Negative 06/07/2012 2124   KETONESUR NEGATIVE 04/12/2016 1222   PROTEINUR NEGATIVE 04/12/2016 1222   NITRITE NEGATIVE 04/12/2016 1222   LEUKOCYTESUR TRACE (A) 04/12/2016 1222   LEUKOCYTESUR Negative 04/01/2015 1003   LEUKOCYTESUR 3+ 06/07/2012 2124    Assessment & Plan:    1. BPH Continue Flomax and finasteride to help future recurrent retention (current PVR safe at 202 cc).  Follow-up annually.  Return in about 1 year (around 08/17/2018).  Hildred Laser, MD  [facility] [address], [address] [place], [place] [address] [phone]

## 2018-03-05 IMAGING — CR DG CHEST 2V
1 series · 2 of 2 positions shown · non-contrast
Comparison: On 03/23/2009

CLINICAL DATA: Asthma.  [REDACTED] assessment.

EXAM:
CHEST  2 VIEW

[Series 1: dg chest 2 view · 0.14mm/px · 2 of 2 slices shown]
[im 1/2]
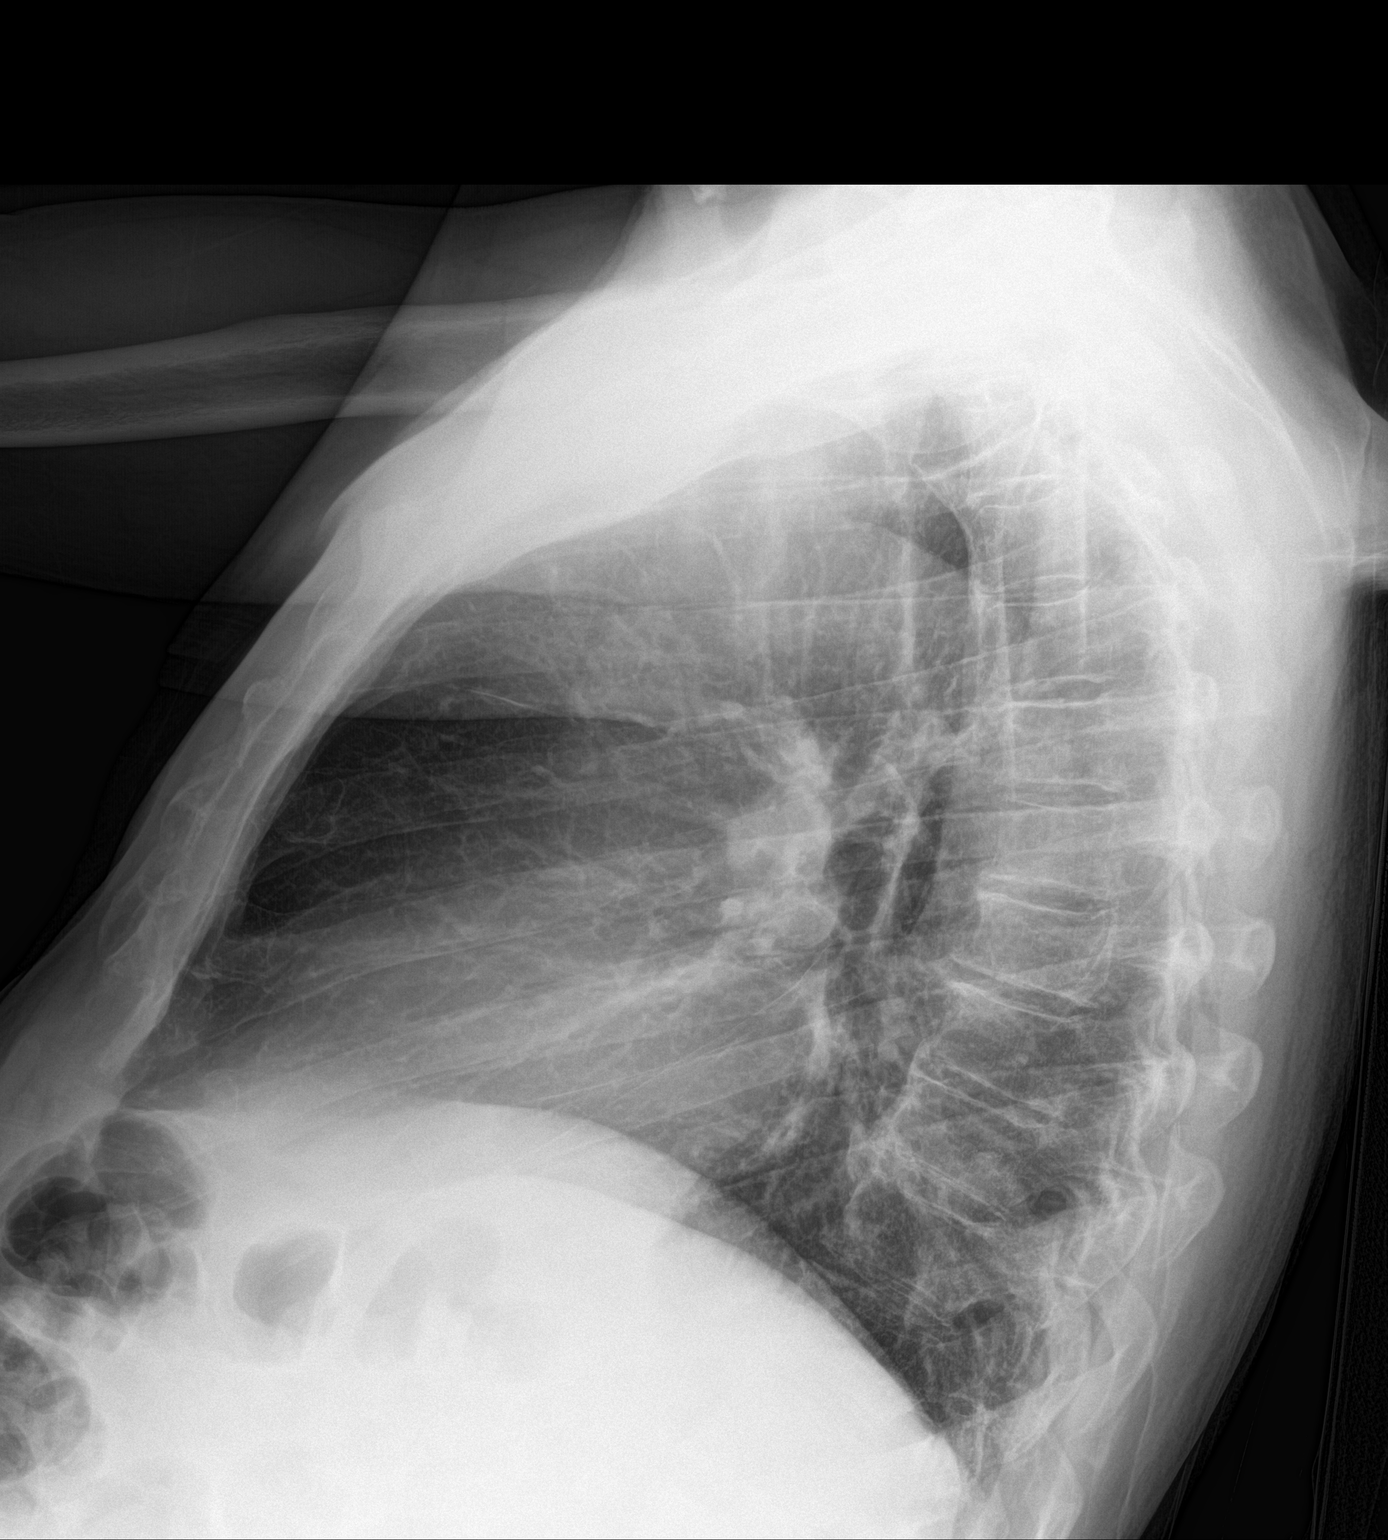
[im 2/2]
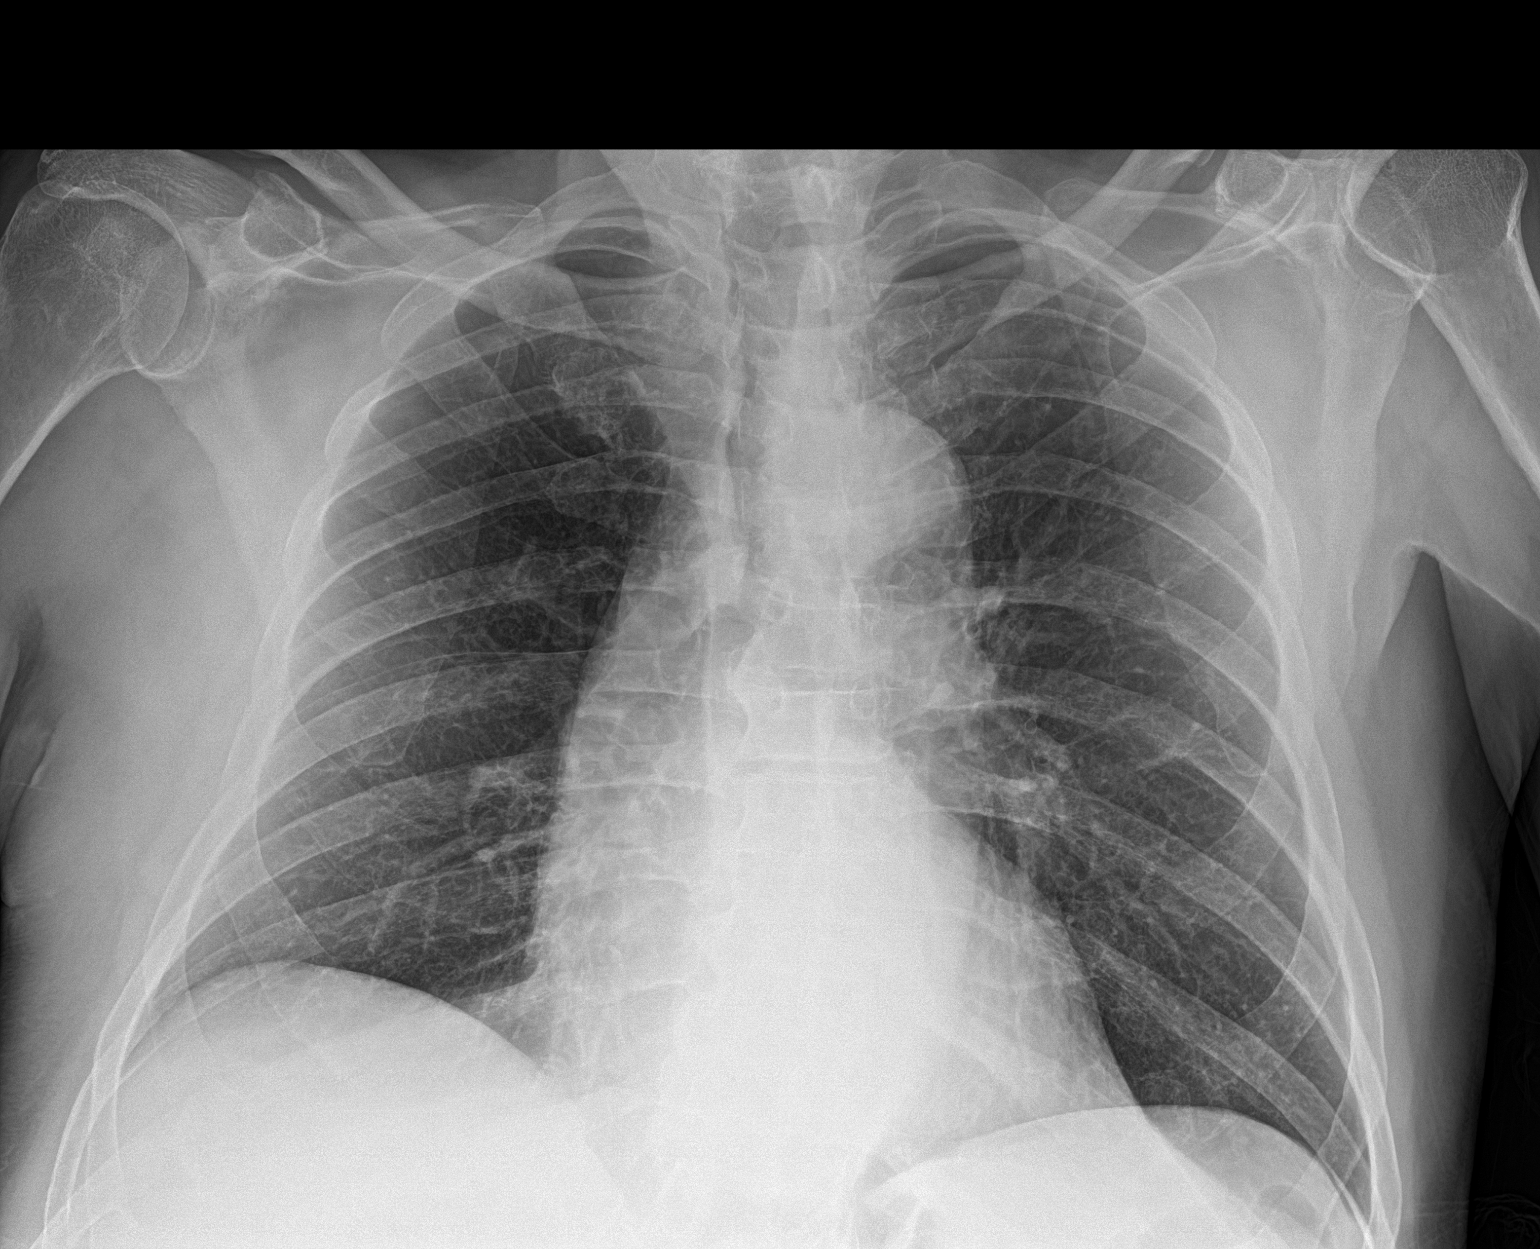

[2 of 2 positions shown; findings below may reference images not displayed]

FINDINGS: Atherosclerotic calcification of the aortic arch with mild
tortuosity. Thoracic spondylosis. The lungs appear clear. Mild
thoracic spondylosis.
IMPRESSION: 1. Aortic tortuosity and atherosclerotic calcification. No acute
findings.

## 2018-08-15 ENCOUNTER — Ambulatory Visit: Payer: Self-pay

## 2018-08-16 ENCOUNTER — Ambulatory Visit: Payer: Self-pay | Admitting: Urology

## 2018-09-11 ENCOUNTER — Ambulatory Visit: Payer: Medicare Other | Admitting: Urology

## 2018-10-02 ENCOUNTER — Ambulatory Visit: Payer: Medicare Other | Admitting: Urology

## 2018-11-13 ENCOUNTER — Telehealth (INDEPENDENT_AMBULATORY_CARE_PROVIDER_SITE_OTHER): Payer: Medicare Other | Admitting: Urology

## 2018-11-13 ENCOUNTER — Telehealth: Payer: Self-pay | Admitting: Urology

## 2018-11-13 ENCOUNTER — Other Ambulatory Visit: Payer: Self-pay

## 2018-11-13 DIAGNOSIS — A630 Anogenital (venereal) warts: Secondary | ICD-10-CM | POA: Diagnosis not present

## 2018-11-13 DIAGNOSIS — N138 Other obstructive and reflux uropathy: Secondary | ICD-10-CM

## 2018-11-13 DIAGNOSIS — R3981 Functional urinary incontinence: Secondary | ICD-10-CM

## 2018-11-13 DIAGNOSIS — N401 Enlarged prostate with lower urinary tract symptoms: Secondary | ICD-10-CM | POA: Diagnosis not present

## 2018-11-13 NOTE — Telephone Encounter (Signed)
Unable to make app due to no schedule for [place] °Printed and gave to Angie to make closer to time °  °  °  °Michelle °

## 2018-11-13 NOTE — Progress Notes (Signed)
Virtual Visit via Telephone Note  I connected with Duane Ochoa on 11/13/18 at  9:30 AM EDT by telephone and verified that I am speaking with the correct person using two identifiers which was answered by his nurse IRIS.    I discussed the limitations, risks, security and privacy concerns of performing an evaluation and management service by telephone and the availability of in person appointments. We discussed the impact of the COVID-19 on the healthcare system, and the importance of social distancing and reducing patient and provider exposure. I also discussed with the patient that there may be a patient responsible charge related to this service. The patient expressed understanding and agreed to proceed.  Reason for visit:   History of Present Illness:  F/u -  1.  BPH Patient is currently on Flomax and finasteride. He as a history of urinary retention and bladder stone and underwent a cystoscopy litholapaxy and HoLEP March 2017 with Dr. Erlene Quan. 50 gm benign prostate on DRE in May 2018. He has worn depends due to urinary incontinence since 2018.  F/u PVR was 202 ml in 2019.   NG risk includes dementia. His caregivers answer for him.   2. Condylomas The patient has had scrotal condylomas since at least 2016 with the largest being approximately 2 cm.  In 2018, he was concerned that the condylomas were the source of his incontinence when he presented to the office.  He was reassured that the condylomas do not cause incontinence.  The decision at that time was to manage his condylomas conservatively due to his age and dementia.  Today, he is voiding without difficulty. He voids in the diaper. Sometimes he makes it to the bathroom. No gross hematuria. No painful voiding. He hasn't scratched the condyloma to make them bleed. They are about the same size.     Assessment and Plan:  bph - continue tamsulosin and finasteride  incontinence - likely a neurogenic and functional component.    Condyloma - stable    Follow Up:  1 year    I discussed the assessment and treatment plan with the patient. The patient/nurse was provided an opportunity to ask questions and all were answered. The patient/nurse agreed with the plan and demonstrated an understanding of the instructions.   The patient/nurse was advised to call back or seek an in-person evaluation if the symptoms worsen or if the condition fails to improve as anticipated.  I provided 5 minutes of non-face-to-face time during this encounter.   Festus Aloe, MD

## 2018-11-13 NOTE — Telephone Encounter (Signed)
-----   Message from Festus Aloe, MD sent at 11/13/2018  1:00 PM EDT -----  I spoke to Mr [facility] nurse today for telehealth visit. He needs f/u in 1 year - Aug 2021 -- thanks. Dr Johnette Abraham

## 2019-05-05 DEATH — deceased

## 2019-12-05 ENCOUNTER — Encounter: Payer: Self-pay | Admitting: Urology

## 2019-12-05 ENCOUNTER — Ambulatory Visit: Payer: Self-pay | Admitting: Urology
